# Patient Record
Sex: Female | Born: 1970 | Race: Black or African American | Hispanic: No | Marital: Single | Smoking: Never smoker
Health system: Southern US, Community
[De-identification: ages and names within clinical notes are randomized; demographics above are authoritative.]

## PROBLEM LIST (undated history)

## (undated) DIAGNOSIS — T7840XA Allergy, unspecified, initial encounter: Secondary | ICD-10-CM

## (undated) DIAGNOSIS — E282 Polycystic ovarian syndrome: Secondary | ICD-10-CM

## (undated) DIAGNOSIS — J45909 Unspecified asthma, uncomplicated: Secondary | ICD-10-CM

## (undated) DIAGNOSIS — R911 Solitary pulmonary nodule: Secondary | ICD-10-CM

## (undated) DIAGNOSIS — E785 Hyperlipidemia, unspecified: Secondary | ICD-10-CM

## (undated) DIAGNOSIS — I2510 Atherosclerotic heart disease of native coronary artery without angina pectoris: Secondary | ICD-10-CM

## (undated) DIAGNOSIS — I10 Essential (primary) hypertension: Secondary | ICD-10-CM

## (undated) DIAGNOSIS — E669 Obesity, unspecified: Secondary | ICD-10-CM

## (undated) DIAGNOSIS — E119 Type 2 diabetes mellitus without complications: Secondary | ICD-10-CM

## (undated) DIAGNOSIS — K219 Gastro-esophageal reflux disease without esophagitis: Secondary | ICD-10-CM

## (undated) HISTORY — DX: Polycystic ovarian syndrome: E28.2

## (undated) HISTORY — DX: Essential (primary) hypertension: I10

## (undated) HISTORY — DX: Allergy, unspecified, initial encounter: T78.40XA

## (undated) HISTORY — PX: WISDOM TOOTH EXTRACTION: SHX21

---

## 2002-05-14 ENCOUNTER — Other Ambulatory Visit: Admission: RE | Admit: 2002-05-14 | Discharge: 2002-05-14 | Payer: Self-pay | Admitting: Family Medicine

## 2003-05-20 ENCOUNTER — Other Ambulatory Visit: Admission: RE | Admit: 2003-05-20 | Discharge: 2003-05-20 | Payer: Self-pay | Admitting: Family Medicine

## 2004-09-11 ENCOUNTER — Other Ambulatory Visit: Admission: RE | Admit: 2004-09-11 | Discharge: 2004-09-11 | Payer: Self-pay | Admitting: Family Medicine

## 2005-09-18 ENCOUNTER — Other Ambulatory Visit: Admission: RE | Admit: 2005-09-18 | Discharge: 2005-09-18 | Payer: Self-pay | Admitting: Family Medicine

## 2011-04-05 ENCOUNTER — Other Ambulatory Visit: Payer: Self-pay | Admitting: Family Medicine

## 2011-04-05 ENCOUNTER — Other Ambulatory Visit (HOSPITAL_COMMUNITY)
Admission: RE | Admit: 2011-04-05 | Discharge: 2011-04-05 | Disposition: A | Discharge status: Home / Self Care | Payer: BC Managed Care – PPO | Source: Ambulatory Visit | Attending: Family Medicine | Admitting: Family Medicine

## 2011-04-05 DIAGNOSIS — Z0000 Encounter for general adult medical examination without abnormal findings: Secondary | ICD-10-CM | POA: Insufficient documentation

## 2013-05-12 ENCOUNTER — Other Ambulatory Visit: Payer: Self-pay | Admitting: Gastroenterology

## 2013-09-29 ENCOUNTER — Other Ambulatory Visit: Payer: Self-pay

## 2013-09-29 DIAGNOSIS — Z1231 Encounter for screening mammogram for malignant neoplasm of breast: Secondary | ICD-10-CM

## 2013-10-29 ENCOUNTER — Ambulatory Visit
Admission: RE | Admit: 2013-10-29 | Discharge: 2013-10-29 | Disposition: A | Discharge status: Home / Self Care | Payer: BC Managed Care – PPO | Source: Ambulatory Visit

## 2013-10-29 DIAGNOSIS — Z1231 Encounter for screening mammogram for malignant neoplasm of breast: Secondary | ICD-10-CM

## 2014-08-19 ENCOUNTER — Other Ambulatory Visit (HOSPITAL_COMMUNITY)
Admission: RE | Admit: 2014-08-19 | Discharge: 2014-08-19 | Disposition: A | Discharge status: Home / Self Care | Payer: BC Managed Care – PPO | Source: Ambulatory Visit | Attending: Family Medicine | Admitting: Family Medicine

## 2014-08-19 ENCOUNTER — Other Ambulatory Visit: Payer: Self-pay | Admitting: Family Medicine

## 2014-08-19 DIAGNOSIS — Z01419 Encounter for gynecological examination (general) (routine) without abnormal findings: Secondary | ICD-10-CM | POA: Insufficient documentation

## 2014-08-19 DIAGNOSIS — Z1151 Encounter for screening for human papillomavirus (HPV): Secondary | ICD-10-CM | POA: Insufficient documentation

## 2014-08-22 LAB — CYTOLOGY - PAP

## 2014-09-26 ENCOUNTER — Other Ambulatory Visit: Payer: Self-pay

## 2014-09-26 DIAGNOSIS — Z1239 Encounter for other screening for malignant neoplasm of breast: Secondary | ICD-10-CM

## 2014-10-28 ENCOUNTER — Other Ambulatory Visit: Payer: Self-pay

## 2014-10-28 DIAGNOSIS — Z1231 Encounter for screening mammogram for malignant neoplasm of breast: Secondary | ICD-10-CM

## 2014-11-01 ENCOUNTER — Ambulatory Visit
Admission: RE | Admit: 2014-11-01 | Discharge: 2014-11-01 | Disposition: A | Discharge status: Home / Self Care | Payer: BC Managed Care – PPO | Source: Ambulatory Visit

## 2014-11-01 DIAGNOSIS — Z1231 Encounter for screening mammogram for malignant neoplasm of breast: Secondary | ICD-10-CM

## 2014-11-11 ENCOUNTER — Other Ambulatory Visit: Payer: Self-pay | Admitting: Allergy and Immunology

## 2014-11-11 ENCOUNTER — Ambulatory Visit
Admission: RE | Admit: 2014-11-11 | Discharge: 2014-11-11 | Disposition: A | Discharge status: Home / Self Care | Payer: BC Managed Care – PPO | Source: Ambulatory Visit | Attending: Allergy and Immunology | Admitting: Allergy and Immunology

## 2014-11-11 DIAGNOSIS — R059 Cough, unspecified: Secondary | ICD-10-CM

## 2014-11-11 DIAGNOSIS — R05 Cough: Secondary | ICD-10-CM

## 2015-10-13 ENCOUNTER — Other Ambulatory Visit: Payer: Self-pay

## 2015-10-13 DIAGNOSIS — Z1231 Encounter for screening mammogram for malignant neoplasm of breast: Secondary | ICD-10-CM

## 2015-11-15 ENCOUNTER — Ambulatory Visit: Payer: BC Managed Care – PPO

## 2016-01-09 ENCOUNTER — Ambulatory Visit
Admission: RE | Admit: 2016-01-09 | Discharge: 2016-01-09 | Disposition: A | Discharge status: Home / Self Care | Payer: BC Managed Care – PPO | Source: Ambulatory Visit

## 2016-01-09 DIAGNOSIS — Z1231 Encounter for screening mammogram for malignant neoplasm of breast: Secondary | ICD-10-CM

## 2016-12-13 ENCOUNTER — Other Ambulatory Visit: Payer: Self-pay | Admitting: Family Medicine

## 2016-12-13 DIAGNOSIS — Z1231 Encounter for screening mammogram for malignant neoplasm of breast: Secondary | ICD-10-CM

## 2017-01-29 ENCOUNTER — Ambulatory Visit
Admission: RE | Admit: 2017-01-29 | Discharge: 2017-01-29 | Disposition: A | Discharge status: Home / Self Care | Payer: BC Managed Care – PPO | Source: Ambulatory Visit | Attending: Family Medicine | Admitting: Family Medicine

## 2017-01-29 DIAGNOSIS — Z1231 Encounter for screening mammogram for malignant neoplasm of breast: Secondary | ICD-10-CM

## 2017-12-25 ENCOUNTER — Other Ambulatory Visit: Payer: Self-pay | Admitting: Family Medicine

## 2017-12-25 DIAGNOSIS — Z1231 Encounter for screening mammogram for malignant neoplasm of breast: Secondary | ICD-10-CM

## 2018-01-30 ENCOUNTER — Ambulatory Visit
Admission: RE | Admit: 2018-01-30 | Discharge: 2018-01-30 | Disposition: A | Discharge status: Home / Self Care | Payer: BC Managed Care – PPO | Source: Ambulatory Visit | Attending: Family Medicine | Admitting: Family Medicine

## 2018-01-30 DIAGNOSIS — Z1231 Encounter for screening mammogram for malignant neoplasm of breast: Secondary | ICD-10-CM

## 2018-12-24 ENCOUNTER — Other Ambulatory Visit: Payer: Self-pay | Admitting: Family Medicine

## 2018-12-24 DIAGNOSIS — Z1231 Encounter for screening mammogram for malignant neoplasm of breast: Secondary | ICD-10-CM

## 2019-02-01 ENCOUNTER — Ambulatory Visit
Admission: RE | Admit: 2019-02-01 | Discharge: 2019-02-01 | Disposition: A | Discharge status: Home / Self Care | Payer: BC Managed Care – PPO | Source: Ambulatory Visit | Attending: Family Medicine | Admitting: Family Medicine

## 2019-02-01 DIAGNOSIS — Z1231 Encounter for screening mammogram for malignant neoplasm of breast: Secondary | ICD-10-CM

## 2020-01-03 ENCOUNTER — Other Ambulatory Visit: Payer: Self-pay | Admitting: Family Medicine

## 2020-01-03 DIAGNOSIS — Z1231 Encounter for screening mammogram for malignant neoplasm of breast: Secondary | ICD-10-CM

## 2020-02-08 ENCOUNTER — Other Ambulatory Visit: Payer: Self-pay

## 2020-02-08 ENCOUNTER — Ambulatory Visit
Admission: RE | Admit: 2020-02-08 | Discharge: 2020-02-08 | Disposition: A | Discharge status: Home / Self Care | Payer: BC Managed Care – PPO | Source: Ambulatory Visit | Attending: Family Medicine | Admitting: Family Medicine

## 2020-02-08 DIAGNOSIS — Z1231 Encounter for screening mammogram for malignant neoplasm of breast: Secondary | ICD-10-CM

## 2020-03-16 ENCOUNTER — Other Ambulatory Visit (HOSPITAL_COMMUNITY)
Admission: RE | Admit: 2020-03-16 | Discharge: 2020-03-16 | Disposition: A | Discharge status: Home / Self Care | Payer: BC Managed Care – PPO | Source: Ambulatory Visit | Attending: Family Medicine | Admitting: Family Medicine

## 2020-03-16 DIAGNOSIS — Z124 Encounter for screening for malignant neoplasm of cervix: Secondary | ICD-10-CM | POA: Insufficient documentation

## 2020-03-20 LAB — CYTOLOGY - PAP
Comment: NEGATIVE
Diagnosis: NEGATIVE
High risk HPV: NEGATIVE

## 2020-10-07 ENCOUNTER — Ambulatory Visit: Payer: BC Managed Care – PPO | Attending: Internal Medicine

## 2020-10-07 DIAGNOSIS — Z23 Encounter for immunization: Secondary | ICD-10-CM

## 2020-10-07 NOTE — Progress Notes (Signed)
   Covid-19 Vaccination Clinic  Name:  Kailynne Ferrington    MRN: [id number] DOB: 17-Jun-1971  10/07/2020  Ms. Lattner was observed post Covid-19 immunization for 15 minutes without incident. She was provided with Vaccine Information Sheet and instruction to access the V-Safe system.   Ms. Menor was instructed to call 911 with any severe reactions post vaccine: Marland Kitchen Difficulty breathing  . Swelling of face and throat  . A fast heartbeat  . A bad rash all over body  . Dizziness and weakness

## 2021-01-10 ENCOUNTER — Other Ambulatory Visit: Payer: Self-pay | Admitting: Family Medicine

## 2021-01-10 DIAGNOSIS — Z1231 Encounter for screening mammogram for malignant neoplasm of breast: Secondary | ICD-10-CM

## 2021-02-09 ENCOUNTER — Inpatient Hospital Stay: Admission: RE | Admit: 2021-02-09 | Payer: BC Managed Care – PPO | Source: Ambulatory Visit

## 2021-02-09 DIAGNOSIS — Z1231 Encounter for screening mammogram for malignant neoplasm of breast: Secondary | ICD-10-CM

## 2021-04-04 ENCOUNTER — Inpatient Hospital Stay: Admission: RE | Admit: 2021-04-04 | Payer: BC Managed Care – PPO | Source: Ambulatory Visit

## 2021-05-23 ENCOUNTER — Ambulatory Visit
Admission: RE | Admit: 2021-05-23 | Discharge: 2021-05-23 | Disposition: A | Discharge status: Home / Self Care | Payer: BC Managed Care – PPO | Source: Ambulatory Visit | Attending: Family Medicine | Admitting: Family Medicine

## 2021-05-23 ENCOUNTER — Other Ambulatory Visit: Payer: Self-pay

## 2021-05-23 DIAGNOSIS — Z1231 Encounter for screening mammogram for malignant neoplasm of breast: Secondary | ICD-10-CM

## 2021-05-25 ENCOUNTER — Other Ambulatory Visit: Payer: Self-pay | Admitting: Family Medicine

## 2021-05-28 ENCOUNTER — Other Ambulatory Visit: Payer: Self-pay | Admitting: Family Medicine

## 2021-05-28 DIAGNOSIS — R928 Other abnormal and inconclusive findings on diagnostic imaging of breast: Secondary | ICD-10-CM

## 2021-05-30 ENCOUNTER — Ambulatory Visit
Admission: RE | Admit: 2021-05-30 | Discharge: 2021-05-30 | Disposition: A | Discharge status: Home / Self Care | Payer: BC Managed Care – PPO | Source: Ambulatory Visit | Attending: Family Medicine | Admitting: Family Medicine

## 2021-05-30 ENCOUNTER — Other Ambulatory Visit: Payer: Self-pay

## 2021-05-30 DIAGNOSIS — R928 Other abnormal and inconclusive findings on diagnostic imaging of breast: Secondary | ICD-10-CM

## 2021-06-26 IMAGING — MG MM DIGITAL SCREENING BILAT W/ TOMO AND CAD
6 of 10 series · 6 of 30 positions shown · non-contrast
Comparison: Previous exam(s).

CLINICAL DATA: Screening.

EXAM:
DIGITAL SCREENING BILATERAL MAMMOGRAM WITH TOMOSYNTHESIS AND CAD
TECHNIQUE: Bilateral screening digital craniocaudal and mediolateral oblique
mammograms were obtained. Bilateral screening digital breast
tomosynthesis was performed. The images were evaluated with
computer-aided detection.

[L MLO synth-2D]
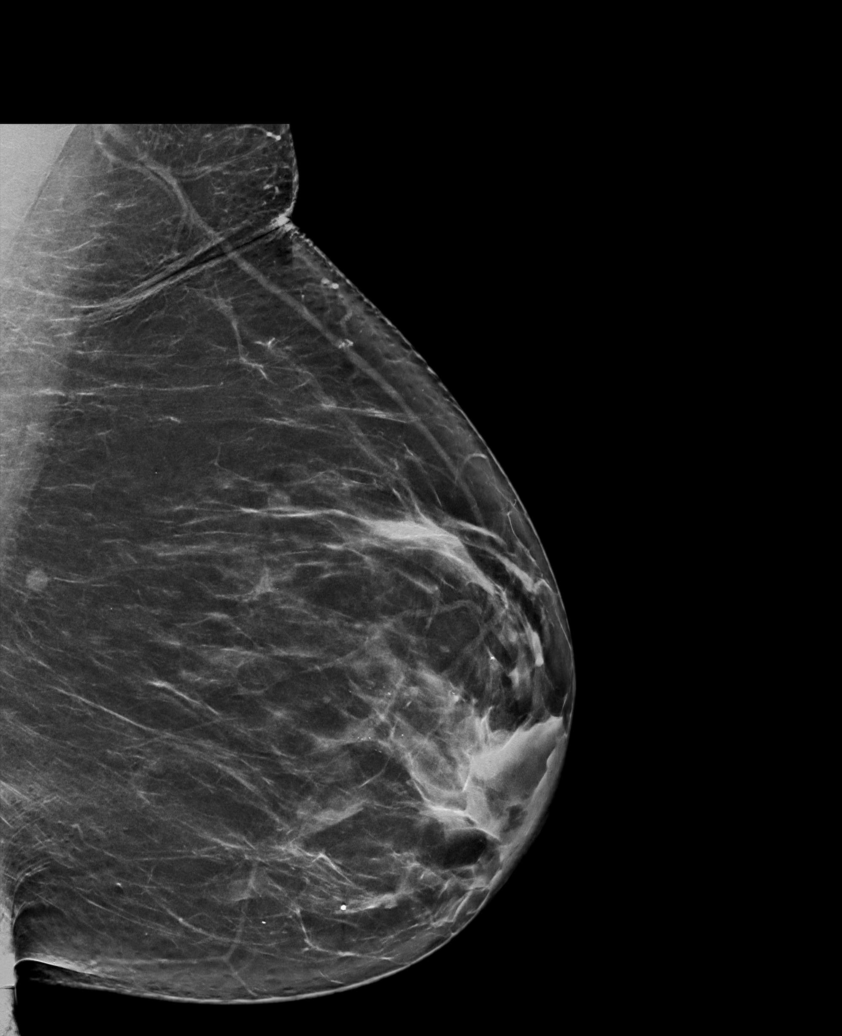

[L CC synth-2D]
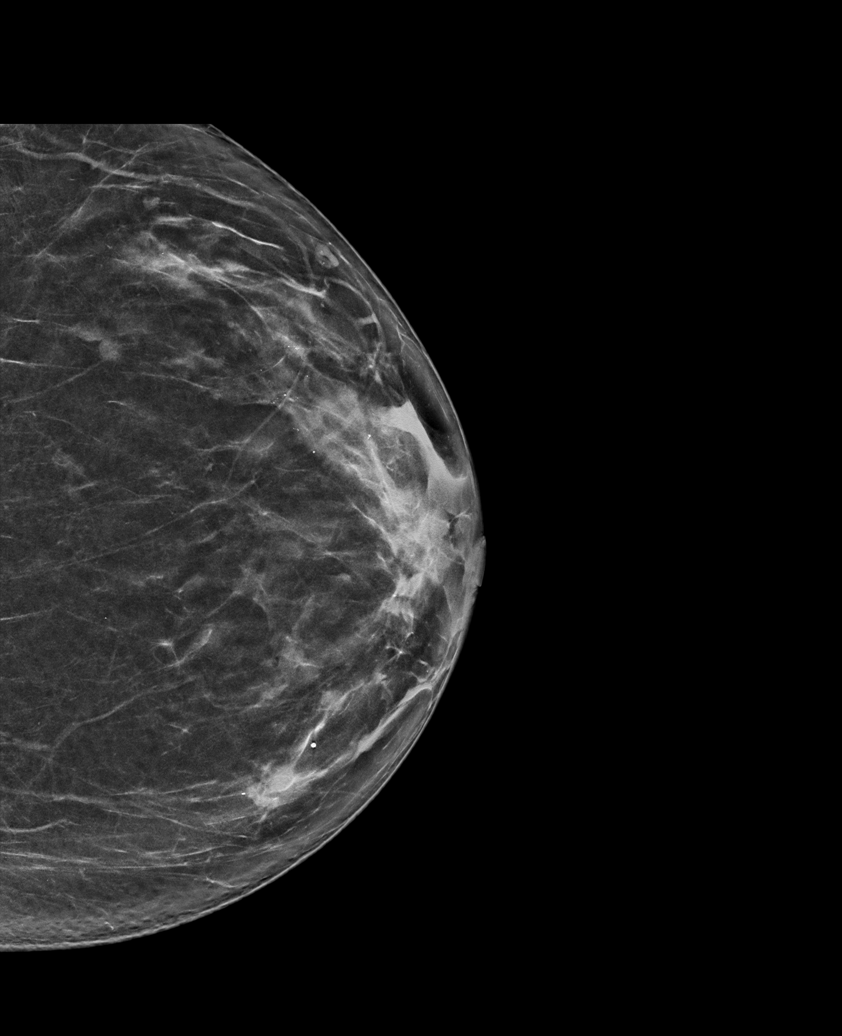

[R CC synth-2D (1 of 2)]
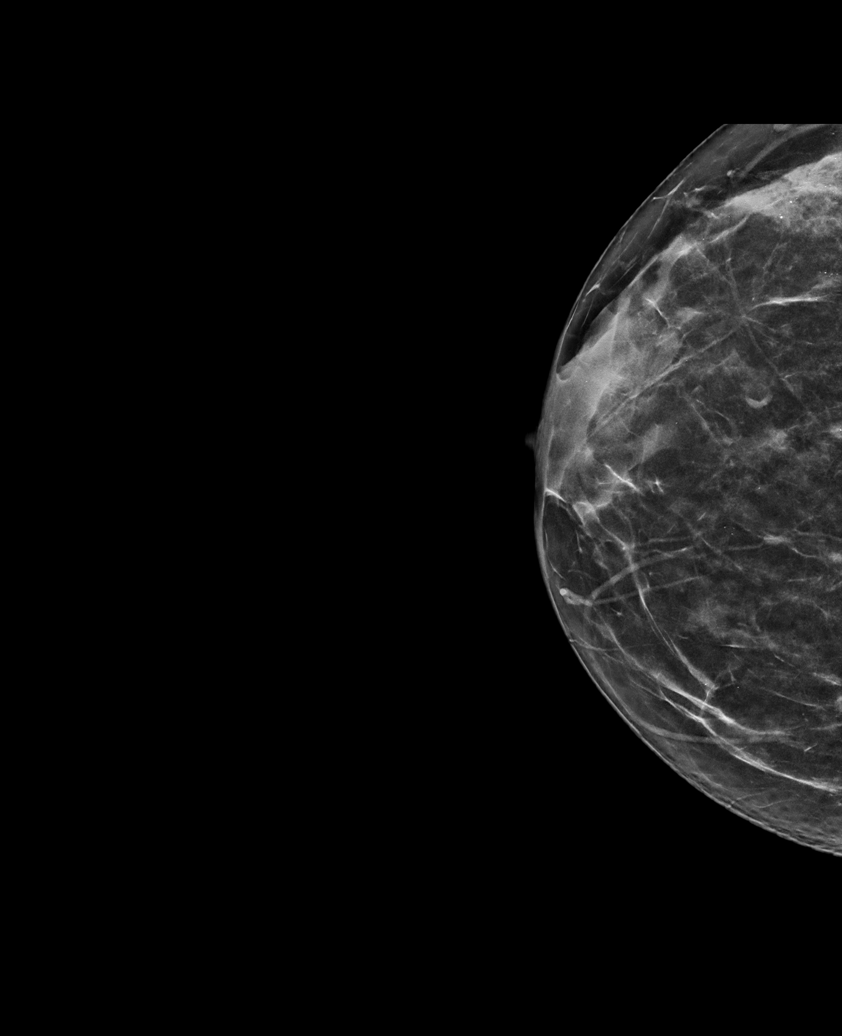

[R MLO synth-2D]
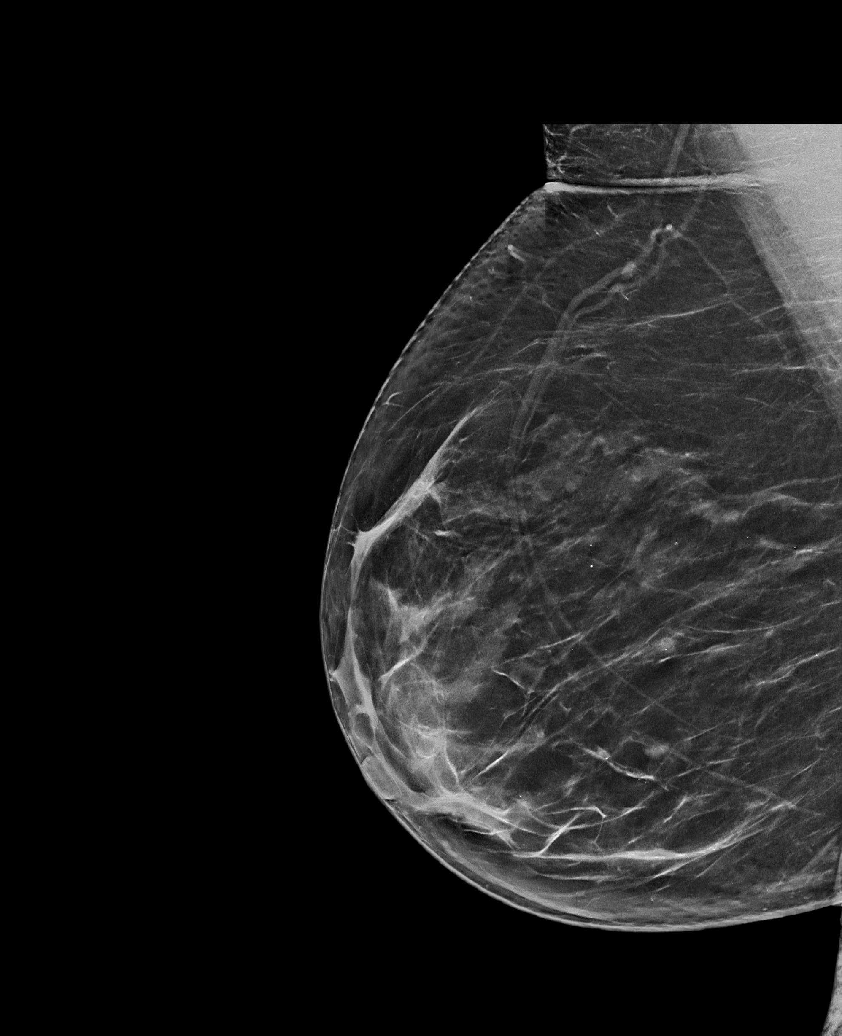

[R CC synth-2D (2 of 2)]
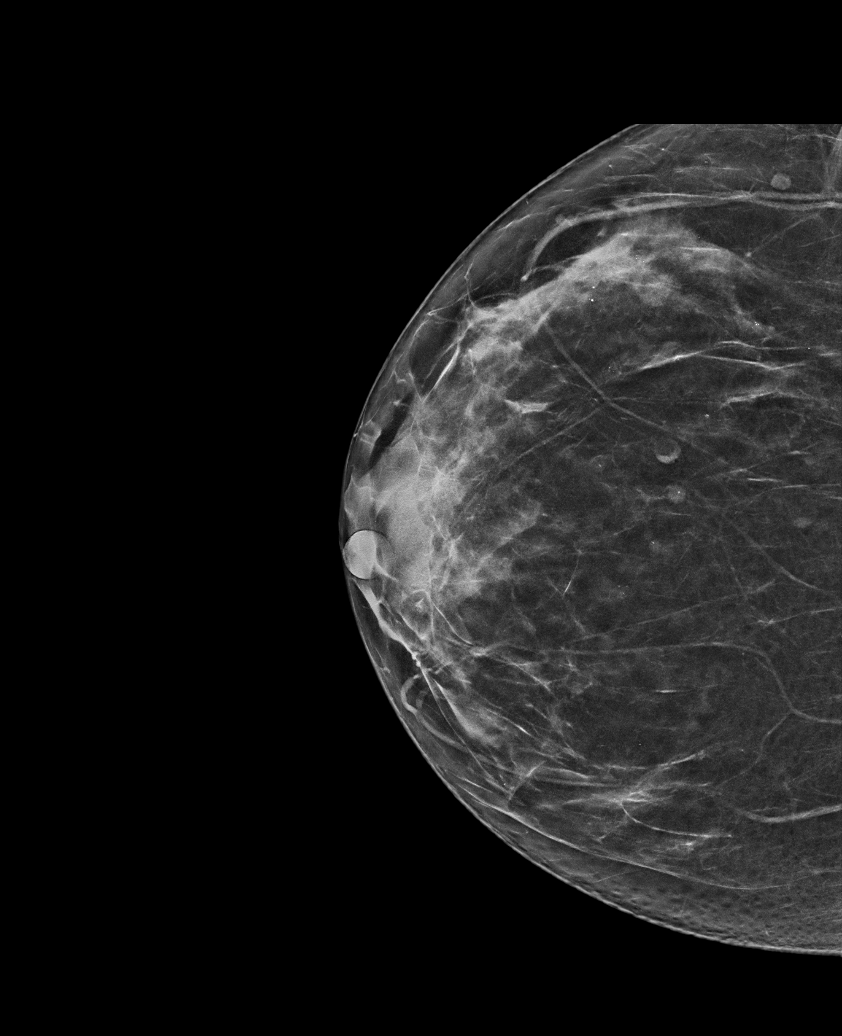

[R CC tomo · tomo slice 35/70.0]
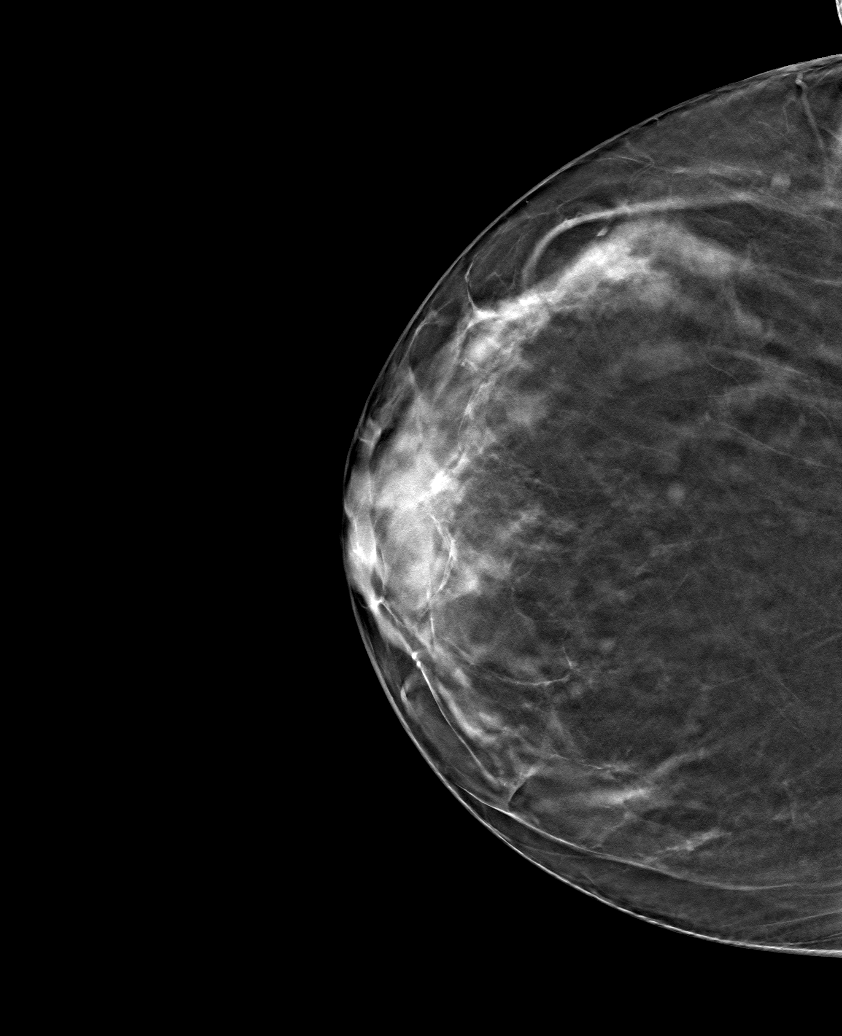

[6 of 30 positions shown; findings below may reference images not displayed]

ACR Breast Density Category c: The breast tissue is heterogeneously
dense, which may obscure small masses.
FINDINGS: In the right breast, a possible mass warrants further evaluation. In
the left breast, no findings suspicious for malignancy.
IMPRESSION: Further evaluation is suggested for a possible mass in the right
breast.

RECOMMENDATION:
Diagnostic mammogram and possibly ultrasound of the right breast.
(Code:82-G-AAO)

The patient will be contacted regarding the findings, and additional
imaging will be scheduled.

BI-RADS CATEGORY  0: Incomplete. Need additional imaging evaluation
and/or prior mammograms for comparison.

## 2022-01-03 ENCOUNTER — Other Ambulatory Visit: Payer: Self-pay | Admitting: Sports Medicine

## 2022-01-03 DIAGNOSIS — M5442 Lumbago with sciatica, left side: Secondary | ICD-10-CM

## 2022-01-27 ENCOUNTER — Other Ambulatory Visit: Payer: Self-pay

## 2022-01-27 ENCOUNTER — Ambulatory Visit
Admission: RE | Admit: 2022-01-27 | Discharge: 2022-01-27 | Disposition: A | Discharge status: Home / Self Care | Payer: BC Managed Care – PPO | Source: Ambulatory Visit | Attending: Sports Medicine | Admitting: Sports Medicine

## 2022-01-27 DIAGNOSIS — M5442 Lumbago with sciatica, left side: Secondary | ICD-10-CM

## 2022-05-16 ENCOUNTER — Other Ambulatory Visit: Payer: Self-pay | Admitting: Family Medicine

## 2022-05-16 DIAGNOSIS — Z1231 Encounter for screening mammogram for malignant neoplasm of breast: Secondary | ICD-10-CM

## 2022-05-31 ENCOUNTER — Ambulatory Visit
Admission: RE | Admit: 2022-05-31 | Discharge: 2022-05-31 | Disposition: A | Discharge status: Home / Self Care | Payer: BC Managed Care – PPO | Source: Ambulatory Visit | Attending: Family Medicine | Admitting: Family Medicine

## 2022-05-31 DIAGNOSIS — Z1231 Encounter for screening mammogram for malignant neoplasm of breast: Secondary | ICD-10-CM

## 2023-04-21 ENCOUNTER — Other Ambulatory Visit: Payer: Self-pay | Admitting: Family Medicine

## 2023-04-21 DIAGNOSIS — Z1231 Encounter for screening mammogram for malignant neoplasm of breast: Secondary | ICD-10-CM

## 2023-06-06 ENCOUNTER — Ambulatory Visit
Admission: RE | Admit: 2023-06-06 | Discharge: 2023-06-06 | Disposition: A | Discharge status: Home / Self Care | Payer: BC Managed Care – PPO | Source: Ambulatory Visit

## 2023-06-06 DIAGNOSIS — Z1231 Encounter for screening mammogram for malignant neoplasm of breast: Secondary | ICD-10-CM

## 2023-07-07 ENCOUNTER — Other Ambulatory Visit (HOSPITAL_BASED_OUTPATIENT_CLINIC_OR_DEPARTMENT_OTHER): Payer: Self-pay | Admitting: Family Medicine

## 2023-07-07 DIAGNOSIS — Z8249 Family history of ischemic heart disease and other diseases of the circulatory system: Secondary | ICD-10-CM

## 2023-08-14 ENCOUNTER — Ambulatory Visit (HOSPITAL_BASED_OUTPATIENT_CLINIC_OR_DEPARTMENT_OTHER): Payer: BC Managed Care – PPO

## 2023-09-25 ENCOUNTER — Ambulatory Visit (HOSPITAL_BASED_OUTPATIENT_CLINIC_OR_DEPARTMENT_OTHER): Payer: BC Managed Care – PPO

## 2023-10-21 ENCOUNTER — Ambulatory Visit (HOSPITAL_BASED_OUTPATIENT_CLINIC_OR_DEPARTMENT_OTHER)
Admission: RE | Admit: 2023-10-21 | Discharge: 2023-10-21 | Disposition: A | Discharge status: Home / Self Care | Payer: BC Managed Care – PPO | Source: Ambulatory Visit | Attending: Family Medicine | Admitting: Family Medicine

## 2023-10-21 DIAGNOSIS — Z8249 Family history of ischemic heart disease and other diseases of the circulatory system: Secondary | ICD-10-CM | POA: Insufficient documentation

## 2024-01-07 ENCOUNTER — Other Ambulatory Visit: Payer: Self-pay | Admitting: Family Medicine

## 2024-01-07 ENCOUNTER — Encounter: Payer: Self-pay | Admitting: Family Medicine

## 2024-01-07 DIAGNOSIS — R911 Solitary pulmonary nodule: Secondary | ICD-10-CM

## 2024-01-08 ENCOUNTER — Inpatient Hospital Stay: Admission: RE | Admit: 2024-01-08 | Payer: Self-pay | Source: Ambulatory Visit

## 2024-01-28 ENCOUNTER — Inpatient Hospital Stay: Admission: RE | Admit: 2024-01-28 | Payer: 59 | Source: Ambulatory Visit

## 2024-02-06 ENCOUNTER — Ambulatory Visit
Admission: RE | Admit: 2024-02-06 | Discharge: 2024-02-06 | Disposition: A | Discharge status: Home / Self Care | Payer: 59 | Source: Ambulatory Visit | Attending: Family Medicine | Admitting: Family Medicine

## 2024-02-06 DIAGNOSIS — R911 Solitary pulmonary nodule: Secondary | ICD-10-CM

## 2024-05-04 ENCOUNTER — Other Ambulatory Visit: Payer: Self-pay | Admitting: Family Medicine

## 2024-05-04 DIAGNOSIS — R911 Solitary pulmonary nodule: Secondary | ICD-10-CM

## 2024-05-12 ENCOUNTER — Other Ambulatory Visit: Payer: Self-pay | Admitting: Family Medicine

## 2024-05-12 DIAGNOSIS — Z1231 Encounter for screening mammogram for malignant neoplasm of breast: Secondary | ICD-10-CM

## 2024-05-17 ENCOUNTER — Encounter: Payer: Self-pay | Admitting: Family Medicine

## 2024-05-18 ENCOUNTER — Other Ambulatory Visit

## 2024-05-20 ENCOUNTER — Ambulatory Visit
Admission: RE | Admit: 2024-05-20 | Discharge: 2024-05-20 | Disposition: A | Discharge status: Home / Self Care | Source: Ambulatory Visit | Attending: Family Medicine | Admitting: Family Medicine

## 2024-05-20 DIAGNOSIS — R911 Solitary pulmonary nodule: Secondary | ICD-10-CM

## 2024-05-31 ENCOUNTER — Other Ambulatory Visit (HOSPITAL_COMMUNITY): Payer: Self-pay | Admitting: Family Medicine

## 2024-05-31 DIAGNOSIS — R918 Other nonspecific abnormal finding of lung field: Secondary | ICD-10-CM

## 2024-05-31 DIAGNOSIS — R911 Solitary pulmonary nodule: Secondary | ICD-10-CM

## 2024-06-02 ENCOUNTER — Telehealth: Payer: Self-pay

## 2024-06-02 ENCOUNTER — Encounter: Payer: Self-pay | Admitting: Pulmonary Disease

## 2024-06-02 ENCOUNTER — Ambulatory Visit: Admitting: Pulmonary Disease

## 2024-06-02 VITALS — BP 132/76 | HR 109 | Temp 97.1°F | Ht 63.0 in | Wt 220.2 lb

## 2024-06-02 DIAGNOSIS — R911 Solitary pulmonary nodule: Secondary | ICD-10-CM | POA: Insufficient documentation

## 2024-06-02 NOTE — Progress Notes (Signed)
 Synopsis: Referred in by Amy Channel, MD   Subjective:   PATIENT ID: Amy Gillespie GENDER: female DOB: 03/17/1971, MRN: [id number]  Chief Complaint  Patient presents with   Consult    Nodule. No SOB or wheezing. Dry cough.     HPI Ms. Amy Gillespie is a pleasant 53 year old female patient with a past medical history of mild persistent asthma, hypertension, hyperlipidemia, type 2 diabetes mellitus presenting today to the pulmonary clinic for further evaluation of right lower lobe lung nodule.  She underwent a CT cardiac scoring to evaluate her coronary artery disease risk factors and was found to have right lower lobe nodule in November 2024.  At that time this measured 10 x 13 mm.  She subsequently had repeat CT chest in February 2025 which redemonstrated the right lower lobe nodule measuring 10 x 8.4 mm.  She had a repeat CT chest in 3 months interval in June 2025 which redemonstrated the proximal left upper cavitary nodule with minimal progression now at 1.6 cm diameter.  A PET scan is scheduled for July.  She denies any respiratory symptoms.  Says her asthma is controlled on Symbicort.  Family history -she denies any family history of lung diseases.  Social history -never smoker denies vaping.  ROS All systems were reviewed and are negative except for the above Objective:   Vitals:   06/02/24 1322  BP: 132/76  Pulse: (!) 109  Temp: (!) 97.1 F (36.2 C)  SpO2: 98%  Weight: 220 lb 3.2 oz (99.9 kg)  Height: 5' 3 (1.6 m)   98% on RA BMI Readings from Last 3 Encounters:  06/02/24 39.01 kg/m   Wt Readings from Last 3 Encounters:  06/02/24 220 lb 3.2 oz (99.9 kg)    Physical Exam GEN: NAD, Healthy Appearing HEENT: Supple Neck, Reactive Pupils, EOMI  CVS: Normal S1, Normal S2, RRR, No murmurs or ES appreciated  Lungs: Clear bilateral air entry.  Abdomen: Soft, non tender, non distended, + BS  Extremities: Warm and well perfused, No edema  Skin: No suspicious  lesions appreciated  Psych: Normal Affect  Ancillary Information   CBC No results found for: WBC, RBC, HGB, HCT, PLT, MCV, MCH, MCHC, RDW, LYMPHSABS, MONOABS, EOSABS, BASOSABS  Labs and imaging were reviewed.     No data to display           Assessment & Plan:  Ms. Amy Gillespie is a pleasant 53 year old female patient with a past medical history of mild persistent asthma, hypertension, hyperlipidemia, type 2 diabetes mellitus presenting today to the pulmonary clinic for further evaluation of right lower lobe lung nodule.  # Right lower lobe nodule Mayo 5.8% probability of malignancy.  Discussed the risk and benefit of proceeding with robotic assisted navigational bronchoscopy including less than 1% risk of hemorrhage and pneumothorax.  She is agreeable with proceeding with the procedure.  []  PET scan and CT super D 06/07/2024.  []  RANB + EBUS 06/15/2024  #Mild persistent asthma  []  C/w Sumbicort 80-4.5 2 Puffs BID.  []  Albuterol PRN    Return in about 3 months (around 09/02/2024).  I spent 60 minutes caring for this patient today, including preparing to see the patient, obtaining a medical history , reviewing a separately obtained history, performing a medically appropriate examination and/or evaluation, counseling and educating the patient/family/caregiver, ordering medications, tests, or procedures, documenting clinical information in the electronic health record, and independently interpreting results (not separately reported/billed) and communicating results to the patient/family/caregiver  Darrin Barn, MD  [facility] 06/02/2024 1:51 PM

## 2024-06-02 NOTE — Telephone Encounter (Signed)
 Robotic Bronchoscopy with EBUS 06/15/2024 at 12:45pm Lung Nodule 31627, 31652, [id number]  [place] please see Bronch info.

## 2024-06-02 NOTE — Telephone Encounter (Signed)
 Patient is aware of date and time. Bronch email has been sent.

## 2024-06-03 NOTE — Telephone Encounter (Signed)
 For the codes 68372, [id number], [phone] Prior Auth Not Required Refer # [id number]

## 2024-06-03 NOTE — Telephone Encounter (Signed)
 Noted. Nothing further needed.

## 2024-06-04 ENCOUNTER — Encounter

## 2024-06-04 DIAGNOSIS — Z1231 Encounter for screening mammogram for malignant neoplasm of breast: Secondary | ICD-10-CM

## 2024-06-07 ENCOUNTER — Ambulatory Visit
Admission: RE | Admit: 2024-06-07 | Discharge: 2024-06-07 | Disposition: A | Discharge status: Home / Self Care | Source: Ambulatory Visit | Attending: Family Medicine | Admitting: Family Medicine

## 2024-06-07 ENCOUNTER — Ambulatory Visit
Admission: RE | Admit: 2024-06-07 | Discharge: 2024-06-07 | Disposition: A | Discharge status: Home / Self Care | Source: Ambulatory Visit | Attending: Pulmonary Disease | Admitting: Pulmonary Disease

## 2024-06-07 DIAGNOSIS — I2510 Atherosclerotic heart disease of native coronary artery without angina pectoris: Secondary | ICD-10-CM | POA: Diagnosis not present

## 2024-06-07 DIAGNOSIS — R918 Other nonspecific abnormal finding of lung field: Secondary | ICD-10-CM

## 2024-06-07 DIAGNOSIS — R911 Solitary pulmonary nodule: Secondary | ICD-10-CM | POA: Diagnosis present

## 2024-06-07 LAB — GLUCOSE, CAPILLARY: Glucose-Capillary: 75 mg/dL (ref 70–99)

## 2024-06-07 MED ORDER — FLUDEOXYGLUCOSE F - 18 (FDG) INJECTION
12.1500 | Freq: Once | INTRAVENOUS | Status: AC | PRN
  Administered 2024-06-07: 12.15 via INTRAVENOUS

## 2024-06-09 ENCOUNTER — Telehealth: Payer: Self-pay | Admitting: Pulmonary Disease

## 2024-06-09 ENCOUNTER — Inpatient Hospital Stay
Admission: RE | Admit: 2024-06-09 | Discharge: 2024-06-09 | Disposition: A | Discharge status: Home / Self Care | Source: Ambulatory Visit

## 2024-06-09 DIAGNOSIS — R911 Solitary pulmonary nodule: Secondary | ICD-10-CM

## 2024-06-09 HISTORY — DX: Solitary pulmonary nodule: R91.1

## 2024-06-09 HISTORY — DX: Hyperlipidemia, unspecified: E78.5

## 2024-06-09 HISTORY — DX: Type 2 diabetes mellitus without complications: E11.9

## 2024-06-09 HISTORY — DX: Unspecified asthma, uncomplicated: J45.909

## 2024-06-09 HISTORY — DX: Gastro-esophageal reflux disease without esophagitis: K21.9

## 2024-06-09 NOTE — Progress Notes (Addendum)
 Called pt to do her anesthesia interview for upcoming EBUS on 06-15-24. PT states she spoke with Dr Malka this morning regarding her PET results. Pt just had a PET scan done and there was no uptake on the PET scan so surgery will be cancelled and will reassess in 3 months to see if EBUS will be needed. Notified Lebron Sharps CMA at Dr Duwaine office  via secure chat

## 2024-06-09 NOTE — Patient Instructions (Signed)
 Your procedure is scheduled on:06-15-24 Tuesday Report to the Registration Desk on the 1st floor of the [facility].Then proceed to the 2nd floor Surgery Desk To find out your arrival time, please call [phone] between 1PM - 3PM on:06-14-24 Monday If your arrival time is 6:00 am, do not arrive before that time as the Medical Mall entrance doors do not open until 6:00 am.  REMEMBER: Instructions that are not followed completely may result in serious medical risk, up to and including death; or upon the discretion of your surgeon and anesthesiologist your surgery may need to be rescheduled.  Do not eat food OR drink liquids after midnight the night before surgery.  No gum chewing or hard candies.  One week prior to surgery:Stop NOW (06-09-24) Stop Anti-inflammatories (NSAIDS) such as Advil, Aleve, Ibuprofen, Motrin, Naproxen, Naprosyn and Aspirin based products such as Excedrin, Goody's Powder, BC Powder. Stop ANY OVER THE COUNTER supplements until after surgery.  You may however, continue to take Tylenol if needed for pain up until the day of surgery  Stop tirzepatide (ZEPBOUND) 7 days prior to surgery-Do NOT take again until AFTER your surgery  Stop metFORMIN (GLUCOPHAGE-XR) 2 days prior to surgery-Last dose will be on 06-12-24 Saturday  Continue taking all of your other prescription medications up until the day of surgery.  ON THE DAY OF SURGERY ONLY TAKE THESE MEDICATIONS WITH SIPS OF WATER: -atorvastatin (LIPITOR)  -esomeprazole (NEXIUM)  -montelukast (SINGULAIR)   Use your Albuterol Inhaler the day of surgery and bring Inhaler to the hospital  No Alcohol for 24 hours before or after surgery.  No Smoking including e-cigarettes for 24 hours before surgery.  No chewable tobacco products for at least 6 hours before surgery.  No nicotine patches on the day of surgery.  Do not use any recreational drugs for at least a week (preferably 2 weeks) before your surgery.  Please be  advised that the combination of cocaine and anesthesia may have negative outcomes, up to and including death. If you test positive for cocaine, your surgery will be cancelled.  On the morning of surgery brush your teeth with toothpaste and water, you may rinse your mouth with mouthwash if you wish. Do not swallow any toothpaste or mouthwash.  Do not wear jewelry, make-up, hairpins, clips or nail polish.  For welded (permanent) jewelry: bracelets, anklets, waist bands, etc.  Please have this removed prior to surgery.  If it is not removed, there is a chance that hospital personnel will need to cut it off on the day of surgery.  Do not wear lotions, powders, or perfumes.   Do not shave body hair from the neck down 48 hours before surgery.  Contact lenses, hearing aids and dentures may not be worn into surgery.  Do not bring valuables to the hospital. [facility] is not responsible for any missing/lost belongings or valuables.   Notify your doctor if there is any change in your medical condition (cold, fever, infection).  Wear comfortable clothing (specific to your surgery type) to the hospital.  After surgery, you can help prevent lung complications by doing breathing exercises.  Take deep breaths and cough every 1-2 hours. Your doctor may order a device called an Incentive Spirometer to help you take deep breaths. When coughing or sneezing, hold a pillow firmly against your incision with both hands. This is called "splinting." Doing this helps protect your incision. It also decreases belly discomfort.  If you are being admitted to the hospital overnight, leave  your suitcase in the car. After surgery it may be brought to your room.  In case of increased patient census, it may be necessary for you, the patient, to continue your postoperative care in the Same Day Surgery department.  If you are being discharged the day of surgery, you will not be allowed to drive home. You will need a  responsible individual to drive you home and stay with you for 24 hours after surgery.   If you are taking public transportation, you will need to have a responsible individual with you.  Please call the Pre-admissions Testing Dept. at [phone] if you have any questions about these instructions.  Surgery Visitation Policy:  Patients having surgery or a procedure may have two visitors.  Children under the age of 80 must have an adult with them who is not the patient.   Merchandiser, retail to address health-related social needs:  [email or web address]

## 2024-06-09 NOTE — Telephone Encounter (Signed)
 Discussed results of the PET scan with [name], lung nodule with faint uptake, 12.2% risk of malignancy based on the herder model as well as MAYO. Given the peripheral position in the lower lobes with high risk of atelectasis and non diagnostic procedure with low risk of malignancy we decided to proceed with radiological monitoring of the nodule and cancel the procedure next week, patient is in agreement.  Darrin Barn, MD [facility] 06/09/2024 5:57 PM

## 2024-06-10 ENCOUNTER — Ambulatory Visit
Admission: RE | Admit: 2024-06-10 | Discharge: 2024-06-10 | Disposition: A | Discharge status: Home / Self Care | Source: Ambulatory Visit

## 2024-06-10 DIAGNOSIS — Z1231 Encounter for screening mammogram for malignant neoplasm of breast: Secondary | ICD-10-CM

## 2024-06-15 ENCOUNTER — Other Ambulatory Visit (HOSPITAL_COMMUNITY)

## 2024-06-15 ENCOUNTER — Encounter: Admission: RE | Payer: Self-pay | Source: Home / Self Care

## 2024-06-15 ENCOUNTER — Ambulatory Visit: Admission: RE | Admit: 2024-06-15 | Source: Home / Self Care | Admitting: Pulmonary Disease

## 2024-06-15 DIAGNOSIS — R911 Solitary pulmonary nodule: Secondary | ICD-10-CM | POA: Insufficient documentation

## 2024-06-15 SURGERY — VIDEO BRONCHOSCOPY WITH ENDOBRONCHIAL NAVIGATION
Anesthesia: General | Laterality: Right

## 2024-09-07 ENCOUNTER — Ambulatory Visit
Admission: RE | Admit: 2024-09-07 | Discharge: 2024-09-07 | Disposition: A | Discharge status: Home / Self Care | Source: Ambulatory Visit | Attending: Pulmonary Disease | Admitting: Pulmonary Disease

## 2024-09-07 DIAGNOSIS — R911 Solitary pulmonary nodule: Secondary | ICD-10-CM | POA: Diagnosis present

## 2024-09-20 ENCOUNTER — Encounter: Payer: Self-pay | Admitting: Pulmonary Disease

## 2024-09-20 ENCOUNTER — Ambulatory Visit: Admitting: Pulmonary Disease

## 2024-09-20 VITALS — BP 124/72 | HR 86 | Temp 97.8°F | Ht 63.0 in | Wt 226.4 lb

## 2024-09-20 DIAGNOSIS — R911 Solitary pulmonary nodule: Secondary | ICD-10-CM | POA: Diagnosis not present

## 2024-09-20 NOTE — Progress Notes (Signed)
 Synopsis: Referred in by Teresa Channel, MD   Subjective:   PATIENT ID: Amy Gillespie GENDER: female DOB: 1971/01/28, MRN: [id number]  Chief Complaint  Patient presents with   Medical Management of Chronic Issues    No SOB, wheezing or cough.  Using Symbicort BID, Albuterol as needed.    HPI Amy Gillespie is a pleasant 53 year old female patient with a past medical history of mild persistent asthma, hypertension, hyperlipidemia, type 2 diabetes mellitus presenting today to the pulmonary clinic for further evaluation of right lower lobe lung nodule.  She underwent a CT cardiac scoring to evaluate her coronary artery disease risk factors and was found to have right lower lobe nodule in November 2024.  At that time this measured 10 x 13 mm.  She subsequently had repeat CT chest in February 2025 which redemonstrated the right lower lobe nodule measuring 10 x 8.4 mm.  She had a repeat CT chest in 3 months interval in June 2025 which redemonstrated the proximal left upper cavitary nodule with minimal progression now at 1.6 cm diameter.  A PET scan is scheduled for July.  She denies any respiratory symptoms.  Says her asthma is controlled on Symbicort.  CT chest wo contrast in 08/2024 shows persistent 13x11 mm nodule with no substantial change from prior.   Family history -she denies any family history of lung diseases.  Social history -never smoker denies vaping.  ROS All systems were reviewed and are negative except for the above Objective:   Vitals:   09/20/24 1434  BP: 124/72  Pulse: 86  Temp: 97.8 F (36.6 C)  SpO2: 100%  Weight: 226 lb 6.4 oz (102.7 kg)  Height: 5' 3 (1.6 m)   100% on RA BMI Readings from Last 3 Encounters:  09/20/24 40.10 kg/m  06/02/24 39.01 kg/m   Wt Readings from Last 3 Encounters:  09/20/24 226 lb 6.4 oz (102.7 kg)  06/02/24 220 lb 3.2 oz (99.9 kg)    Physical Exam GEN: NAD, Healthy Appearing HEENT: Supple Neck, Reactive Pupils, EOMI   CVS: Normal S1, Normal S2, RRR, No murmurs or ES appreciated  Lungs: Clear bilateral air entry.  Abdomen: Soft, non tender, non distended, + BS  Extremities: Warm and well perfused, No edema  Skin: No suspicious lesions appreciated  Psych: Normal Affect  Ancillary Information   CBC No results found for: WBC, RBC, HGB, HCT, PLT, MCV, MCH, MCHC, RDW, LYMPHSABS, MONOABS, EOSABS, BASOSABS  Labs and imaging were reviewed.     No data to display           Assessment & Plan:  Amy Gillespie is a pleasant 53 year old female patient with a past medical history of mild persistent asthma, hypertension, hyperlipidemia, type 2 diabetes mellitus presenting today to the pulmonary clinic for further evaluation of right lower lobe lung nodule.  # Right lower lobe nodule Mayo 5.8% probability of malignancy.  Discussed the risk and benefit of proceeding with robotic assisted navigational bronchoscopy including less than 1% risk of hemorrhage and pneumothorax.  She is agreeable with proceeding with the procedure but would like to schedule it in January 2026. Given stability for a year, we agreed to proceed with the procedure on Jan 13th 2026.   []  CT super D 12/20/2024.  []  RANB + EBUS 12/21/2024/  #Mild persistent asthma  []  C/w Sumbicort 80-4.5 2 Puffs BID.  []  Albuterol PRN    RTC 3 months.   I personally spent a total of 40 minutes in the  care of the patient today including preparing to see the patient, getting/reviewing separately obtained history, performing a medically appropriate exam/evaluation, counseling and educating, placing orders, documenting clinical information in the EHR, independently interpreting results, and communicating results.   Darrin Barn, MD [facility] 09/20/2024 3:41 PM

## 2024-10-04 ENCOUNTER — Encounter (HOSPITAL_BASED_OUTPATIENT_CLINIC_OR_DEPARTMENT_OTHER): Payer: Self-pay

## 2024-10-07 ENCOUNTER — Ambulatory Visit (HOSPITAL_BASED_OUTPATIENT_CLINIC_OR_DEPARTMENT_OTHER): Admitting: Cardiology

## 2024-10-07 ENCOUNTER — Encounter (HOSPITAL_BASED_OUTPATIENT_CLINIC_OR_DEPARTMENT_OTHER): Payer: Self-pay | Admitting: Cardiology

## 2024-10-07 VITALS — BP 114/80 | HR 88 | Ht 63.0 in | Wt 225.5 lb

## 2024-10-07 DIAGNOSIS — Z712 Person consulting for explanation of examination or test findings: Secondary | ICD-10-CM | POA: Diagnosis not present

## 2024-10-07 DIAGNOSIS — E7800 Pure hypercholesterolemia, unspecified: Secondary | ICD-10-CM

## 2024-10-07 DIAGNOSIS — I10 Essential (primary) hypertension: Secondary | ICD-10-CM

## 2024-10-07 DIAGNOSIS — E669 Obesity, unspecified: Secondary | ICD-10-CM

## 2024-10-07 DIAGNOSIS — I493 Ventricular premature depolarization: Secondary | ICD-10-CM

## 2024-10-07 DIAGNOSIS — I2510 Atherosclerotic heart disease of native coronary artery without angina pectoris: Secondary | ICD-10-CM | POA: Diagnosis not present

## 2024-10-07 DIAGNOSIS — Z8249 Family history of ischemic heart disease and other diseases of the circulatory system: Secondary | ICD-10-CM | POA: Diagnosis not present

## 2024-10-07 DIAGNOSIS — Z7189 Other specified counseling: Secondary | ICD-10-CM

## 2024-10-07 MED ORDER — ASPIRIN 81 MG PO TBEC
81.0000 mg | DELAYED_RELEASE_TABLET | Freq: Every day | ORAL | Status: AC
Start: 2024-10-07 — End: ?

## 2024-10-07 NOTE — Progress Notes (Signed)
 Cardiology Office Note:  .   Date:  10/07/2024  ID:  Amy Gillespie, DOB 1971/03/06, MRN [id number] PCP: Teresa Channel, MD  [facility] Providers Cardiologist:  Shelda Bruckner, MD {  History of Present Illness: .   Amy Gillespie is a 53 y.o. female with PMH hypertension, prediabetes, hyperlipidemia, obesity. She is seen as a new patient consultation at the request of Dr. Bari for aortic atherosclerosis and hypertension.  Referral from 07/12/24 reviewed. Referral notes aortic atherosclerosis and hypertension. Referral note from office visit 07/08/24 from Dr. Bari notes history of prediabetes, hyperlipidemia, hypertension, obesity, and aortic atherosclerosis. Noted that hypertension was controlled at that visit at 113/78.  Today: Her concerns today are regarding family history and calcium score 10/2023. Ca score 10/2023 was 83.4 (97th %ile). Pulmonary nodules noted, have been followed serially with imaging and PET scan, follows with pulmonary.  FH: Mother was a smoker, had history of hypertension, died in her 20s from MI. Mat gpa died of heart issues in his 7s, had siblings that died young as well. No genetic testing that she knows of. Father was a smoker, had CABG in his late 34s. Also had COPD, alcohol history. Sister has controlled hypertension, on a statin.  Started on atorvastatin after calcium score results. Lipids from 12/2023 show Tchol 138, HDL 89, LDL 37, TG. Had not been told previously that she needed to be on cholesterol medication. At one point told that is was related to PCOS.   Discussed calcium score at length, guidelines and recommendations.  Working on [employer] loss with [employer]. Walks, does aqua exercise.   Reviewed ECG, noted intermittent PVCs. She is asymptomatic.  ROS: Denies chest pain, shortness of breath at rest or with normal exertion. No PND, orthopnea, LE edema or unexpected weight gain. No syncope or palpitations. ROS otherwise negative  except as noted.   Studies Reviewed: SABRA    EKG:  EKG Interpretation Date/Time:  Thursday October 07 2024 08:47:39 EDT Ventricular Rate:  88 PR Interval:  152 QRS Duration:  78 QT Interval:  350 QTC Calculation: 423 R Axis:   15  Text Interpretation: Sinus rhythm with frequent Premature ventricular complexes Cannot rule out Anterior infarct , age undetermined No previous ECGs available Confirmed by Bruckner Shelda [id number]) on 10/07/2024 9:05:43 AM    Physical Exam:   VS:  BP 114/80   Pulse 88   Ht 5' 3 (1.6 m)   Wt 225 lb 8 oz (102.3 kg)   SpO2 99%   BMI 39.95 kg/m    Wt Readings from Last 3 Encounters:  10/07/24 225 lb 8 oz (102.3 kg)  09/20/24 226 lb 6.4 oz (102.7 kg)  06/02/24 220 lb 3.2 oz (99.9 kg)    GEN: Well nourished, well developed in no acute distress HEENT: Normal, moist mucous membranes NECK: No JVD CARDIAC: regular rhythm with occasional premature beat, normal S1 and S2, no rubs or gallops. No murmur. VASCULAR: Radial and DP pulses 2+ bilaterally. No carotid bruits RESPIRATORY:  Clear to auscultation without rales, wheezing or rhonchi  ABDOMEN: Soft, non-tender, non-distended MUSCULOSKELETAL:  Ambulates independently SKIN: Warm and dry, no edema NEUROLOGIC:  Alert and oriented x 3. No focal neuro deficits noted. PSYCHIATRIC:  Normal affect    ASSESSMENT AND PLAN: .    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  CV  risk counseling and prevention -recommend heart healthy/Mediterranean diet, with whole grains, fruits, vegetable, fish, lean meats, nuts, and olive oil. Limit salt. -recommend moderate walking, 3-5 times/week for 30-50 minutes each session. Aim for at least 150 minutes/week. Goal should be pace of 3 miles/hours, or walking 1.5 miles in 30 minutes -recommend avoidance of tobacco products. Avoid excess alcohol.  Dispo: 1 year   Signed, Shelda Bruckner, MD   Shelda Bruckner, MD, PhD, [facility]  [facility]  [place]  [facility] at [facility] at [facility] [address], [address] [place], [place] [address] [phone]

## 2024-10-07 NOTE — Patient Instructions (Signed)
 Medication Instructions:  Your physician has recommended you make the following change in your medication:  1.) start aspirin 81 mg - take one tablet daily  *If you need a refill on your cardiac medications before your next appointment, please call your pharmacy*  Lab Work: Today:  Lp(a)   Testing/Procedures: none  Follow-Up: At [facility], you and your health needs are our priority.  As part of our continuing mission to provide you with exceptional heart care, our providers are all part of one team.  This team includes your primary Cardiologist (physician) and Advanced Practice Providers or APPs (Physician Assistants and Nurse Practitioners) who all work together to provide you with the care you need, when you need it.  Your next appointment:   12 month(s)  Provider:   Shelda Bruckner, MD, Rosaline Bane, NP, or Reche Finder, NP

## 2024-10-09 LAB — LIPOPROTEIN A (LPA): Lipoprotein (a): 77.6 nmol/L — ABNORMAL HIGH (ref <75.0)

## 2024-10-27 ENCOUNTER — Ambulatory Visit (HOSPITAL_BASED_OUTPATIENT_CLINIC_OR_DEPARTMENT_OTHER): Payer: Self-pay | Admitting: Cardiology

## 2024-11-24 ENCOUNTER — Ambulatory Visit: Admitting: Pulmonary Disease

## 2024-11-29 ENCOUNTER — Telehealth: Payer: Self-pay

## 2024-11-29 NOTE — Telephone Encounter (Signed)
 Robotic Bronchoscopy with EBUS 12/21/2024 at 7:30am. Lung Nodule 31627, [id number], [id number]  Amy Gillespie please see bronch info.

## 2024-11-29 NOTE — Telephone Encounter (Signed)
 Lm x1 for the patient.

## 2024-12-13 NOTE — Telephone Encounter (Signed)
 For the codes 68372, [id number], [phone] Prior Auth Not Required Refer # [id number]

## 2024-12-13 NOTE — Telephone Encounter (Signed)
 Noted. Nothing further needed.

## 2024-12-14 ENCOUNTER — Encounter
Admission: RE | Admit: 2024-12-14 | Discharge: 2024-12-14 | Disposition: A | Discharge status: Home / Self Care | Source: Ambulatory Visit | Attending: Pulmonary Disease | Admitting: Pulmonary Disease

## 2024-12-14 ENCOUNTER — Other Ambulatory Visit: Payer: Self-pay

## 2024-12-14 VITALS — Ht 63.0 in | Wt 219.0 lb

## 2024-12-14 DIAGNOSIS — I10 Essential (primary) hypertension: Secondary | ICD-10-CM

## 2024-12-14 DIAGNOSIS — R911 Solitary pulmonary nodule: Secondary | ICD-10-CM

## 2024-12-14 DIAGNOSIS — E119 Type 2 diabetes mellitus without complications: Secondary | ICD-10-CM

## 2024-12-14 HISTORY — DX: Atherosclerotic heart disease of native coronary artery without angina pectoris: I25.10

## 2024-12-14 HISTORY — DX: Solitary pulmonary nodule: R91.1

## 2024-12-14 HISTORY — DX: Obesity, unspecified: E66.9

## 2024-12-14 NOTE — Patient Instructions (Addendum)
 Your procedure is scheduled on: Tuesday 12/21/24 Report to the Registration Desk on the 1st floor of the [facility]. To find out your arrival time, please call [phone] between 1PM - 3PM on: Monday 12/20/24 If your arrival time is 6:00 am, do not arrive before that time as the [facility] entrance doors do not open until 6:00 am.  REMEMBER: Instructions that are not followed completely may result in serious medical risk, up to and including death; or upon the discretion of your surgeon and anesthesiologist your surgery may need to be rescheduled.  Do not eat food or drink fluids after midnight the night before surgery.  No gum chewing or hard candies.  One week prior to surgery: Stop Anti-inflammatories (NSAIDS) such as Advil, Aleve, Ibuprofen, Motrin, Naproxen, Naprosyn and Aspirin  based products such as Excedrin, Goody's Powder, BC Powder. Stop ANY OVER THE COUNTER supplements until after surgery. Iron-FA-B Cmp-C-Biot-Probiotic   You may however, continue to take Tylenol if needed for pain up until the day of surgery.  Stop metFORMIN (GLUCOPHAGE-XR) 500 MG 2 days prior to surgery (take last dose Saturday 12/18/24).  Stop WEGOVY 1 MG/0.5ML SOAJ SQ injection 1 week prior to surgery (Take last dose 12/14/24)  Continue taking all of your other prescription medications up until the day of surgery.  ON THE DAY OF SURGERY ONLY TAKE THESE MEDICATIONS WITH SIPS OF WATER:  esomeprazole (NEXIUM) 20 MG   Use inhalers on the day of surgery and bring to the hospital. albuterol (VENTOLIN HFA) 108 (90 Base) MCG/ACT inhaler  budesonide-formoterol (SYMBICORT) 80-4.5 MCG/ACT inhaler    No Alcohol for 24 hours before or after surgery.  No Smoking including e-cigarettes for 24 hours before surgery.  No chewable tobacco products for at least 6 hours before surgery.  No nicotine patches on the day of surgery.  Do not use any recreational drugs for at least a week (preferably 2 weeks) before  your surgery.  Please be advised that the combination of cocaine and anesthesia may have negative outcomes, up to and including death. If you test positive for cocaine, your surgery will be cancelled.  On the morning of surgery brush your teeth with toothpaste and water, you may rinse your mouth with mouthwash if you wish. Do not swallow any toothpaste or mouthwash.  Use CHG Soap or wipes as directed on instruction sheet.  Do not wear jewelry, make-up, hairpins, clips or nail polish.  For welded (permanent) jewelry: bracelets, anklets, waist bands, etc.  Please have this removed prior to surgery.  If it is not removed, there is a chance that hospital personnel will need to cut it off on the day of surgery.  Do not wear lotions, powders, or perfumes.   Do not shave body hair from the neck down 48 hours before surgery.  Contact lenses, hearing aids and dentures may not be worn into surgery.  Do not bring valuables to the hospital. [facility] is not responsible for any missing/lost belongings or valuables.   Notify your doctor if there is any change in your medical condition (cold, fever, infection).  Wear comfortable clothing (specific to your surgery type) to the hospital.  After surgery, you can help prevent lung complications by doing breathing exercises.  Take deep breaths and cough every 1-2 hours. Your doctor may order a device called an Incentive Spirometer to help you take deep breaths. When coughing or sneezing, hold a pillow firmly against your incision with both hands. This is called splinting. Doing this  helps protect your incision. It also decreases belly discomfort.  If you are being admitted to the hospital overnight, leave your suitcase in the car. After surgery it may be brought to your room.  In case of increased patient census, it may be necessary for you, the patient, to continue your postoperative care in the Same Day Surgery department.  If you are being  discharged the day of surgery, you will not be allowed to drive home. You will need a responsible individual to drive you home and stay with you for 24 hours after surgery.   If you are taking public transportation, you will need to have a responsible individual with you.  Please call the Pre-admissions Testing Dept. at [phone] if you have any questions about these instructions.  Surgery Visitation Policy:  Patients having surgery or a procedure may have two visitors.  Children under the age of 65 must have an adult with them who is not the patient.  Inpatient Visitation:    Visiting hours are 7 a.m. to 8 p.m. Up to four visitors are allowed at one time in a patient room. The visitors may rotate out with other people during the day.  One visitor age 22 or older may stay with the patient overnight and must be in the room by 8 p.m.   Merchandiser, Retail to address health-related social needs:  [email or web address]

## 2024-12-15 ENCOUNTER — Encounter
Admission: RE | Admit: 2024-12-15 | Discharge: 2024-12-15 | Disposition: A | Discharge status: Home / Self Care | Source: Ambulatory Visit | Attending: Pulmonary Disease | Admitting: Pulmonary Disease

## 2024-12-15 DIAGNOSIS — Z01812 Encounter for preprocedural laboratory examination: Secondary | ICD-10-CM | POA: Diagnosis not present

## 2024-12-15 DIAGNOSIS — I10 Essential (primary) hypertension: Secondary | ICD-10-CM | POA: Insufficient documentation

## 2024-12-15 DIAGNOSIS — E119 Type 2 diabetes mellitus without complications: Secondary | ICD-10-CM | POA: Diagnosis not present

## 2024-12-15 DIAGNOSIS — Z01818 Encounter for other preprocedural examination: Secondary | ICD-10-CM | POA: Diagnosis present

## 2024-12-15 LAB — BASIC METABOLIC PANEL WITH GFR
Anion gap: 13 (ref 5–15)
BUN: 10 mg/dL (ref 6–20)
CO2: 24 mmol/L (ref 22–32)
Calcium: 9.7 mg/dL (ref 8.9–10.3)
Chloride: 101 mmol/L (ref 98–111)
Creatinine, Ser: 0.78 mg/dL (ref 0.44–1.00)
GFR, Estimated: >60 mL/min
Glucose, Bld: 78 mg/dL (ref 70–99)
Potassium: 3.8 mmol/L (ref 3.5–5.1)
Sodium: 138 mmol/L (ref 135–145)

## 2024-12-15 LAB — CBC
HCT: 37.6 % (ref 36.0–46.0)
Hemoglobin: 11.9 g/dL — ABNORMAL LOW (ref 12.0–15.0)
MCH: 27.5 pg (ref 26.0–34.0)
MCHC: 31.6 g/dL (ref 30.0–36.0)
MCV: 87 fL (ref 80.0–100.0)
Platelets: 365 K/uL (ref 150–400)
RBC: 4.32 MIL/uL (ref 3.87–5.11)
RDW: 14 % (ref 11.5–15.5)
WBC: 6.6 K/uL (ref 4.0–10.5)
nRBC: 0 % (ref 0.0–0.2)

## 2024-12-15 NOTE — Progress Notes (Signed)
 Last Cardiology visit:   Lonni Slain, MD  Physician Cardiology   Progress Notes    Signed   Encounter Date: 10/07/2024   Signed       Cardiology Office Note:  .   Date:  10/07/2024  ID:  Amy Gillespie, DOB [birth year], MRN [id number] PCP: Teresa Channel, MD  [facility] Providers Cardiologist:  Slain Lonni, MD {   History of Present Illness: .   Amy Gillespie is a 54 y.o. female with PMH hypertension, prediabetes, hyperlipidemia, obesity. She is seen as a new patient consultation at the request of Dr. Bari for aortic atherosclerosis and hypertension.   Referral from 07/12/24 reviewed. Referral notes aortic atherosclerosis and hypertension. Referral note from office visit 07/08/24 from Dr. Bari notes history of prediabetes, hyperlipidemia, hypertension, obesity, and aortic atherosclerosis. Noted that hypertension was controlled at that visit at 113/78.   Today: Her concerns today are regarding family history and calcium score 10/2023. Ca score 10/2023 was 83.4 (97th %ile). Pulmonary nodules noted, have been followed serially with imaging and PET scan, follows with pulmonary.   FH: Mother was a smoker, had history of hypertension, died in her 1s from MI. Mat gpa died of heart issues in his 90s, had siblings that died young as well. No genetic testing that she knows of. Father was a smoker, had CABG in his late 77s. Also had COPD, alcohol history. Sister has controlled hypertension, on a statin.   Started on atorvastatin after calcium score results. Lipids from 12/2023 show Tchol 138, HDL 89, LDL 37, TG. Had not been told previously that she needed to be on cholesterol medication. At one point told that is was related to PCOS.    Discussed calcium score at length, guidelines and recommendations.   Working on [employer] loss with [employer]. Walks, does aqua exercise.    Reviewed ECG, noted intermittent PVCs. She is asymptomatic.   ROS: Denies  chest pain, shortness of breath at rest or with normal exertion. No PND, orthopnea, LE edema or unexpected weight gain. No syncope or palpitations. ROS otherwise negative except as noted.    Studies Reviewed: SABRA     EKG:  EKG Interpretation Date/Time:                  Thursday October 07 2024 08:47:39 EDT Ventricular Rate:         88 PR Interval:                 152 QRS Duration:             78 QT Interval:                 350 QTC Calculation:423 R Axis:                         15   Text Interpretation:Sinus rhythm with frequent Premature ventricular complexes Cannot rule out Anterior infarct , age undetermined No previous ECGs available Confirmed by Lonni Slain [phone]) on 10/07/2024 9:05:43 AM     Physical Exam:   VS:  BP 114/80   Pulse 88   Ht 5' 3 (1.6 m)   Wt 225 lb 8 oz (102.3 kg)   SpO2 99%   BMI 39.95 kg/m       Wt Readings from Last 3 Encounters:  10/07/24 225 lb 8 oz (102.3 kg)  09/20/24 226 lb 6.4 oz (102.7 kg)  06/02/24 220 lb 3.2 oz (99.9  kg)    GEN: Well nourished, well developed in no acute distress HEENT: Normal, moist mucous membranes NECK: No JVD CARDIAC: regular rhythm with occasional premature beat, normal S1 and S2, no rubs or gallops. No murmur. VASCULAR: Radial and DP pulses 2+ bilaterally. No carotid bruits RESPIRATORY:  Clear to auscultation without rales, wheezing or rhonchi  ABDOMEN: Soft, non-tender, non-distended MUSCULOSKELETAL:  Ambulates independently SKIN: Warm and dry, no edema NEUROLOGIC:  Alert and oriented x 3. No focal neuro deficits noted. PSYCHIATRIC:  Normal affect     ASSESSMENT AND PLAN: .     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 Hypercholesterolemia -discussed test results, recommendations, LDL goal <70 -continue atorvastatin 20 mg daily -discussed recommendations for aspirin , she is amenable, discussed watching for side effects -discussed lp(a) today, she is amenable -reviewed red flag  warning signs that need immediate medical attentions   PVCs -note on ECG, asymptomatic -discussed signs/symptoms to watch for   Hypertension -at goal -continue losartan, spironolactone   Obesity, BMI 39 -working with [employer] [facility] [place]   CV risk counseling and prevention -recommend heart healthy/Mediterranean diet, with whole grains, fruits, vegetable, fish, lean meats, nuts, and olive oil. Limit salt. -recommend moderate walking, 3-5 times/week for 30-50 minutes each session. Aim for at least 150 minutes/week. Goal should be pace of 3 miles/hours, or walking 1.5 miles in 30 minutes -recommend avoidance of tobacco products. Avoid excess alcohol.   Dispo: 1 year    Signed, Shelda Bruckner, MD   Shelda Bruckner, MD, PhD, [facility] [place]  [facility]  [place]  [facility] at [facility] at [facility] [address], [address] [place], [place] [address] [phone]          Electronically signed by Bruckner Shelda, MD at 10/07/2024  6:15 PM     Office Visit on 10/07/2024       Detailed Report      Note shared with patient Additional Documentation  Vitals: BP 114/80   Pulse 88   Ht 5' 3 (1.6 m)   Wt 102.3 kg   SpO2 99%   BMI 39.95 kg/m   BSA 2.13 m  Flowsheets: Patient-Reported Data,   ...(5 more)  Encounter Info: Billing Info,   History,   Allergies,   Detailed Report   Orders Placed   Lipoprotein A (LPA)  EKG 12-Lead All Encounter Results Medication Changes   Aspirin  81 mg Oral Daily, Swallow whole. Medication List Visit Diagnoses   Nonobstructive atherosclerosis of coronary artery  Encounter to discuss test results  Pure hypercholesterolemia  Family history of premature coronary artery disease  PVC (premature ventricular contraction)  Essential hypertension  Obesity (BMI 30-39.9)  Cardiac risk counseling  Counseling on health promotion and  disease prevention

## 2024-12-20 ENCOUNTER — Ambulatory Visit
Admission: RE | Admit: 2024-12-20 | Discharge: 2024-12-20 | Disposition: A | Discharge status: Home / Self Care | Source: Ambulatory Visit | Attending: Pulmonary Disease | Admitting: Pulmonary Disease

## 2024-12-20 DIAGNOSIS — R911 Solitary pulmonary nodule: Secondary | ICD-10-CM | POA: Insufficient documentation

## 2024-12-21 ENCOUNTER — Ambulatory Visit
Admission: RE | Admit: 2024-12-21 | Discharge: 2024-12-21 | Disposition: A | Discharge status: Home / Self Care | Attending: Pulmonary Disease | Admitting: Pulmonary Disease

## 2024-12-21 ENCOUNTER — Ambulatory Visit

## 2024-12-21 ENCOUNTER — Other Ambulatory Visit: Payer: Self-pay

## 2024-12-21 ENCOUNTER — Encounter: Admission: RE | Disposition: A | Payer: Self-pay | Source: Home / Self Care | Attending: Pulmonary Disease

## 2024-12-21 ENCOUNTER — Encounter: Payer: Self-pay | Admitting: Pulmonary Disease

## 2024-12-21 DIAGNOSIS — E785 Hyperlipidemia, unspecified: Secondary | ICD-10-CM | POA: Diagnosis not present

## 2024-12-21 DIAGNOSIS — K219 Gastro-esophageal reflux disease without esophagitis: Secondary | ICD-10-CM | POA: Diagnosis not present

## 2024-12-21 DIAGNOSIS — E119 Type 2 diabetes mellitus without complications: Secondary | ICD-10-CM | POA: Diagnosis not present

## 2024-12-21 DIAGNOSIS — J4530 Mild persistent asthma, uncomplicated: Secondary | ICD-10-CM | POA: Diagnosis not present

## 2024-12-21 DIAGNOSIS — I10 Essential (primary) hypertension: Secondary | ICD-10-CM | POA: Diagnosis not present

## 2024-12-21 DIAGNOSIS — R911 Solitary pulmonary nodule: Secondary | ICD-10-CM | POA: Diagnosis not present

## 2024-12-21 DIAGNOSIS — C3431 Malignant neoplasm of lower lobe, right bronchus or lung: Secondary | ICD-10-CM | POA: Insufficient documentation

## 2024-12-21 HISTORY — PX: VIDEO BRONCHOSCOPY WITH ENDOBRONCHIAL NAVIGATION: SHX6175

## 2024-12-21 HISTORY — PX: ENDOBRONCHIAL ULTRASOUND: SHX5096

## 2024-12-21 LAB — POCT PREGNANCY, URINE: Preg Test, Ur: NEGATIVE

## 2024-12-21 LAB — GLUCOSE, CAPILLARY
Glucose-Capillary: 83 mg/dL (ref 70–99)
Glucose-Capillary: 97 mg/dL (ref 70–99)

## 2024-12-21 MED ORDER — DROPERIDOL 2.5 MG/ML IJ SOLN: 0.6250 mg | Freq: Once | INTRAMUSCULAR | Status: DC | PRN

## 2024-12-21 MED ORDER — DEXMEDETOMIDINE HCL IN NACL 80 MCG/20ML IV SOLN
INTRAVENOUS | Status: AC
  Filled 2024-12-21: qty 20

## 2024-12-21 MED ORDER — SUGAMMADEX SODIUM 200 MG/2ML IV SOLN
INTRAVENOUS | Status: DC | PRN
  Administered 2024-12-21 (×7): 50 mg via INTRAVENOUS

## 2024-12-21 MED ORDER — LIDOCAINE HCL (CARDIAC) PF 100 MG/5ML IV SOSY
PREFILLED_SYRINGE | INTRAVENOUS | Status: DC | PRN
  Administered 2024-12-21: 60 mg via INTRAVENOUS
  Administered 2024-12-21: 40 mg via INTRAVENOUS

## 2024-12-21 MED ORDER — ROCURONIUM BROMIDE 10 MG/ML (PF) SYRINGE
PREFILLED_SYRINGE | INTRAVENOUS | Status: AC
  Filled 2024-12-21: qty 10

## 2024-12-21 MED ORDER — ONDANSETRON HCL 4 MG/2ML IJ SOLN
INTRAMUSCULAR | Status: AC
  Filled 2024-12-21: qty 2

## 2024-12-21 MED ORDER — ONDANSETRON HCL 4 MG/2ML IJ SOLN
INTRAMUSCULAR | Status: DC | PRN
  Administered 2024-12-21: 4 mg via INTRAVENOUS

## 2024-12-21 MED ORDER — MIDAZOLAM HCL 2 MG/2ML IJ SOLN
INTRAMUSCULAR | Status: AC
  Filled 2024-12-21: qty 2

## 2024-12-21 MED ORDER — ORAL CARE MOUTH RINSE: 15.0000 mL | Freq: Once | OROMUCOSAL | Status: AC

## 2024-12-21 MED ORDER — DEXAMETHASONE SOD PHOSPHATE PF 10 MG/ML IJ SOLN
INTRAMUSCULAR | Status: AC
  Filled 2024-12-21: qty 1

## 2024-12-21 MED ORDER — CHLORHEXIDINE GLUCONATE 0.12 % MT SOLN
OROMUCOSAL | Status: AC
  Filled 2024-12-21: qty 15

## 2024-12-21 MED ORDER — PROPOFOL 1000 MG/100ML IV EMUL
INTRAVENOUS | Status: AC
  Filled 2024-12-21: qty 100

## 2024-12-21 MED ORDER — MIDAZOLAM HCL (PF) 2 MG/2ML IJ SOLN
INTRAMUSCULAR | Status: DC | PRN
  Administered 2024-12-21: 2 mg via INTRAVENOUS

## 2024-12-21 MED ORDER — CHLORHEXIDINE GLUCONATE 0.12 % MT SOLN
15.0000 mL | Freq: Once | OROMUCOSAL | Status: AC
  Administered 2024-12-21: 15 mL via OROMUCOSAL

## 2024-12-21 MED ORDER — DEXAMETHASONE SOD PHOSPHATE PF 10 MG/ML IJ SOLN
INTRAMUSCULAR | Status: DC | PRN
  Administered 2024-12-21: 10 mg via INTRAVENOUS

## 2024-12-21 MED ORDER — LIDOCAINE HCL (PF) 2 % IJ SOLN
INTRAMUSCULAR | Status: AC
  Filled 2024-12-21: qty 5

## 2024-12-21 MED ORDER — PROPOFOL 10 MG/ML IV BOLUS
INTRAVENOUS | Status: DC | PRN
  Administered 2024-12-21: 150 ug/kg/min via INTRAVENOUS

## 2024-12-21 MED ORDER — ROCURONIUM BROMIDE 100 MG/10ML IV SOLN
INTRAVENOUS | Status: DC | PRN
  Administered 2024-12-21: 40 mg via INTRAVENOUS
  Administered 2024-12-21 (×2): 10 mg via INTRAVENOUS

## 2024-12-21 MED ORDER — SODIUM CHLORIDE 0.9 % IV SOLN: INTRAVENOUS | Status: DC

## 2024-12-21 MED ORDER — FENTANYL CITRATE (PF) 100 MCG/2ML IJ SOLN: 25.0000 ug | INTRAMUSCULAR | Status: DC | PRN

## 2024-12-21 MED ORDER — DEXMEDETOMIDINE HCL IN NACL 80 MCG/20ML IV SOLN
INTRAVENOUS | Status: DC | PRN
  Administered 2024-12-21: 8 ug via INTRAVENOUS
  Administered 2024-12-21: 12 ug via INTRAVENOUS

## 2024-12-21 NOTE — H&P (Signed)
 Amy Gillespie is a pleasant 54 year old female patient with a past medical history of mild persistent asthma, hypertension, hyperlipidemia, type 2 diabetes mellitus presenting today for a robotic assisted navigational bronchoscopy of right lower lobe lung nodule.   # Right lower lobe nodule Mayo 5.8% probability of malignancy.  Discussed the risk and benefit of proceeding with robotic assisted navigational bronchoscopy including less than 1% risk of hemorrhage and pneumothorax.  She is agreeable with proceeding with the procedure.   Gen: NAD  CVS: Normal S1, Normal S2, RRR  Lungs: Clear bilat air entry  Abdomen: Soft, non tender,  Extremities Warm and well perfused.   Patient is adequate for the procedure.   Darrin Barn, MD [facility] 12/21/2024 7:41 AM

## 2024-12-21 NOTE — Anesthesia Preprocedure Evaluation (Signed)
"                                    Anesthesia Evaluation  Patient identified by MRN, date of birth, ID band Patient awake    Reviewed: Allergy & Precautions, H&P , NPO status , Patient's Chart, lab work & pertinent test results, reviewed documented beta blocker date and time   History of Anesthesia Complications Negative for: history of anesthetic complications  Airway Mallampati: II  TM Distance: >3 FB Neck ROM: full    Dental  (+) Dental Advidsory Given, Caps   Pulmonary neg shortness of breath, asthma , neg sleep apnea, neg COPD, neg recent URI   Pulmonary exam normal breath sounds clear to auscultation       Cardiovascular Exercise Tolerance: Good hypertension, (-) angina (-) Past MI and (-) Cardiac Stents Normal cardiovascular exam(-) dysrhythmias (-) Valvular Problems/Murmurs Rhythm:regular Rate:Normal     Neuro/Psych negative neurological ROS  negative psych ROS   GI/Hepatic Neg liver ROS,GERD  Medicated and Controlled,,  Endo/Other  diabetes (borderline)    Renal/GU negative Renal ROS  negative genitourinary   Musculoskeletal   Abdominal   Peds  Hematology negative hematology ROS (+)   Anesthesia Other Findings Past Medical History: No date: Allergies No date: Asthma No date: DM (diabetes mellitus), type 2 (HCC) No date: GERD (gastroesophageal reflux disease) No date: Hyperlipidemia No date: Hypertension No date: Lung nodule No date: Nonobstructive atherosclerosis of coronary artery No date: Obesity (BMI 30-39.9) No date: Polycystic ovaries No date: Right lower lobe pulmonary nodule   Reproductive/Obstetrics negative OB ROS                              Anesthesia Physical Anesthesia Plan  ASA: 2  Anesthesia Plan: General   Post-op Pain Management:    Induction: Intravenous  PONV Risk Score and Plan: 3 and Ondansetron , Dexamethasone , Treatment may vary due to age or medical condition and  Midazolam   Airway Management Planned: Oral ETT  Additional Equipment:   Intra-op Plan:   Post-operative Plan: Extubation in OR  Informed Consent: I have reviewed the patients History and Physical, chart, labs and discussed the procedure including the risks, benefits and alternatives for the proposed anesthesia with the patient or authorized representative who has indicated his/her understanding and acceptance.     Dental Advisory Given  Plan Discussed with: Anesthesiologist, CRNA and Surgeon  Anesthesia Plan Comments:         Anesthesia Quick Evaluation  "

## 2024-12-21 NOTE — Op Note (Signed)
 Video Bronchoscopy with Robotic Assisted Bronchoscopic Navigation   Date of Operation: 12/21/2024   Pre-op Diagnosis: Lung Nodule  Post-op Diagnosis: Lung Nodule  Surgeon: Afsana Liera  Anesthesia: General endotracheal anesthesia  Operation: Flexible video fiberoptic bronchoscopy with robotic assistance and biopsies.  Estimated Blood Loss: Minimal  Complications: None  Indications and History: Amy Gillespie is a 54 y.o. female with history of RLL Nodule.  Recommendation made to achieve a tissue diagnosis via robotic assisted navigational bronchoscopy.  The risks, benefits, complications, treatment options and expected outcomes were discussed with the patient.  The possibilities of pneumothorax, pneumonia, reaction to medication, pulmonary aspiration, perforation of a viscus, bleeding, failure to diagnose a condition and creating a complication requiring transfusion or operation were discussed with the patient who freely signed the consent.    Description of Procedure: The patient was seen in the Preoperative Area, was examined and was deemed appropriate to proceed.  The patient was taken to [facility] Endoscopy room 3, identified as Amy Gillespie and the procedure verified as Flexible Video Fiberoptic Bronchoscopy.  A Time Out was held and the above information confirmed.   Prior to the date of the procedure a high-resolution CT scan of the chest was performed. Utilizing ION software program a virtual tracheobronchial tree was generated to allow the creation of distinct navigation pathways to the patient's parenchymal abnormalities. After being taken to the operating room general anesthesia was initiated and the patient  was orally intubated. The video fiberoptic bronchoscope was introduced via the endotracheal tube and a general inspection was performed which showed normal right and left lung anatomy. Aspiration of the bilateral mainstems was completed to remove any remaining secretions. Robotic  catheter inserted into patient's endotracheal tube.   Target #1 RLL Nodule: The distinct navigation pathways prepared prior to this procedure were then utilized to navigate to patient's lesion identified on CT scan. The robotic catheter was secured into place and the vision probe was withdrawn.  Lesion location was approximated using fluoroscopy.  Local registration and targeting was performed using GE mobile C-arm three-dimensional imaging and REBUS. Under fluoroscopic guidance transbronchial needle biopsies, and transbronchial forceps biopsies were performed to be sent for cytology and pathology.  Needle-in-lesion was confirmed using GE mobile C-arm.  A bronchioalveolar lavage was performed in the RLL and sent for cytology.   EBUS Survey of 4L, 4R, 7, 11Rs and 11Ri did not reveal any abnormal lymph nodes. All nodes were < 0.5cm.   At the end of the procedure a general airway inspection was performed and there was no evidence of active bleeding. The bronchoscope was removed.  The patient tolerated the procedure well. There was no significant blood loss and there were no obvious complications. A post-procedural chest x-ray is pending.  Samples Target #1: 1. Transbronchial needle biopsies from RLL Nodule 2. Transbronchial forceps biopsies from RLL Nodule. 3. Bronchoalveolar lavage from RLL.  Plans:  The patient will be discharged from the PACU to home when recovered from anesthesia and after chest x-ray is reviewed. We will review the cytology, pathology and microbiology results with the patient when they become available. Outpatient followup will be with Chia Mowers.  Darrin Barn, MD [facility] 12/21/2024 9:17 AM

## 2024-12-21 NOTE — Anesthesia Procedure Notes (Signed)
 Procedure Name: Intubation Date/Time: 12/21/2024 8:09 AM  Performed by: Governor Selinda NOVAK, CRNAPre-anesthesia Checklist: Patient identified, Emergency Drugs available, Suction available and Patient being monitored Patient Re-evaluated:Patient Re-evaluated prior to induction Oxygen Delivery Method: Circle system utilized Preoxygenation: Pre-oxygenation with 100% oxygen Induction Type: IV induction Ventilation: Mask ventilation without difficulty Laryngoscope Size: McGrath and 3 Grade View: Grade I Tube type: Oral Tube size: 8.5 mm Number of attempts: 1 Placement Confirmation: ETT inserted through vocal cords under direct vision, positive ETCO2 and breath sounds checked- equal and bilateral (Bronchoscopy) Secured at: 21 cm Tube secured with: Tape Dental Injury: Teeth and Oropharynx as per pre-operative assessment  Difficulty Due To: Difficult Airway-  due to edematous airway

## 2024-12-21 NOTE — Transfer of Care (Signed)
 Immediate Anesthesia Transfer of Care Note  Patient: Amy Gillespie  Procedure(s) Performed: VIDEO BRONCHOSCOPY WITH ENDOBRONCHIAL NAVIGATION (Right) ENDOBRONCHIAL ULTRASOUND (EBUS) (Bilateral)  Patient Location: PACU  Anesthesia Type:General  Level of Consciousness: awake and alert   Airway & Oxygen Therapy: Patient Spontanous Breathing and Patient connected to nasal cannula oxygen  Post-op Assessment: Report given to RN and Post -op Vital signs reviewed and stable  Post vital signs: stable  Last Vitals:  Vitals Value Taken Time  BP 108/77 12/21/24 09:32  Temp    Pulse 96 12/21/24 09:43  Resp 31 12/21/24 09:43  SpO2 100 % 12/21/24 09:43  Vitals shown include unfiled device data.  Last Pain:  Vitals:   12/21/24 0702  TempSrc: Temporal  PainSc: 0-No pain         Complications: There were no known notable events for this encounter.

## 2024-12-21 NOTE — Anesthesia Postprocedure Evaluation (Signed)
"   Anesthesia Post Note  Patient: Amy Gillespie  Procedure(s) Performed: VIDEO BRONCHOSCOPY WITH ENDOBRONCHIAL NAVIGATION (Right) ENDOBRONCHIAL ULTRASOUND (EBUS) (Bilateral)  Patient location during evaluation: PACU Anesthesia Type: General Level of consciousness: awake and alert Pain management: pain level controlled Vital Signs Assessment: post-procedure vital signs reviewed and stable Respiratory status: spontaneous breathing, nonlabored ventilation, respiratory function stable and patient connected to nasal cannula oxygen Cardiovascular status: blood pressure returned to baseline and stable Postop Assessment: no apparent nausea or vomiting Anesthetic complications: no   There were no known notable events for this encounter.   Last Vitals:  Vitals:   12/21/24 1000 12/21/24 1016  BP: 107/77 110/87  Pulse: 99 90  Resp: 16 18  Temp:  36.5 C  SpO2: 100% 99%    Last Pain:  Vitals:   12/21/24 1016  TempSrc: Temporal  PainSc: 0-No pain                 Prentice Murphy      "

## 2024-12-21 NOTE — Op Note (Signed)
" ° °  RLL Nodule Tool-in-Lesion  Darrin Barn, MD [facility] 12/21/2024 9:20 AM   "

## 2024-12-22 ENCOUNTER — Encounter: Payer: Self-pay | Admitting: Pulmonary Disease

## 2024-12-23 ENCOUNTER — Telehealth: Payer: Self-pay | Admitting: Pulmonary Disease

## 2024-12-23 ENCOUNTER — Telehealth: Payer: Self-pay

## 2024-12-23 DIAGNOSIS — R911 Solitary pulmonary nodule: Secondary | ICD-10-CM

## 2024-12-23 LAB — CYTOLOGY - NON PAP

## 2024-12-23 LAB — SURGICAL PATHOLOGY

## 2024-12-23 NOTE — Telephone Encounter (Signed)
 Called and discussed the results of Ms. 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   Referral to CTS and Heme/Onc placed.   Darrin Barn, MD [facility] 12/23/2024 2:17 PM

## 2024-12-23 NOTE — Telephone Encounter (Signed)
 Noted. Nothing further needed.

## 2024-12-23 NOTE — Telephone Encounter (Signed)
 Copied from CRM [id number]. Topic: Clinical - Lab/Test Results >> Dec 23, 2024  1:44 PM Corean SAUNDERS wrote: Reason for CRM: Patient states she missed two calls from the clinic and she states she believes its regarding bronchoscopy results but there is not a note in the chart regarding this , please call patient back to advise.

## 2024-12-23 NOTE — Addendum Note (Signed)
 Addended by: MALKA DOMINO on: 12/23/2024 02:18 PM   Modules accepted: Orders

## 2024-12-24 LAB — CULTURE, RESPIRATORY W GRAM STAIN
Culture: NO GROWTH
Gram Stain: NONE SEEN

## 2024-12-29 ENCOUNTER — Ambulatory Visit

## 2024-12-29 DIAGNOSIS — R911 Solitary pulmonary nodule: Secondary | ICD-10-CM | POA: Diagnosis not present

## 2024-12-29 LAB — PULMONARY FUNCTION TEST
DL/VA % pred: 127 %
DL/VA: 5.48 ml/min/mmHg/L
DLCO cor % pred: 134 %
DLCO cor: 27.12 ml/min/mmHg
DLCO unc % pred: 127 %
DLCO unc: 25.78 ml/min/mmHg
FEF 25-75 Post: 3.92 L/s
FEF 25-75 Pre: 3.73 L/s
FEF2575-%Change-Post: 5 %
FEF2575-%Pred-Post: 147 %
FEF2575-%Pred-Pre: 140 %
FEV1-%Change-Post: 1 %
FEV1-%Pred-Post: 106 %
FEV1-%Pred-Pre: 104 %
FEV1-Post: 2.85 L
FEV1-Pre: 2.82 L
FEV1FVC-%Change-Post: 0 %
FEV1FVC-%Pred-Pre: 105 %
FEV6-%Change-Post: 0 %
FEV6-%Pred-Post: 101 %
FEV6-%Pred-Pre: 101 %
FEV6-Post: 3.36 L
FEV6-Pre: 3.35 L
FEV6FVC-%Change-Post: 0 %
FEV6FVC-%Pred-Post: 102 %
FEV6FVC-%Pred-Pre: 102 %
FVC-%Change-Post: 0 %
FVC-%Pred-Post: 98 %
FVC-%Pred-Pre: 98 %
FVC-Post: 3.36 L
FVC-Pre: 3.35 L
Post FEV1/FVC ratio: 85 %
Post FEV6/FVC ratio: 100 %
Pre FEV1/FVC ratio: 84 %
Pre FEV6/FVC Ratio: 100 %
RV % pred: 92 %
RV: 1.63 L
TLC % pred: 105 %
TLC: 5.16 L

## 2024-12-29 NOTE — Patient Instructions (Signed)
 Full PFT completed today ? ?

## 2024-12-29 NOTE — Progress Notes (Signed)
 Full PFT completed today ? ?

## 2025-01-03 ENCOUNTER — Inpatient Hospital Stay: Admitting: Internal Medicine

## 2025-01-03 ENCOUNTER — Inpatient Hospital Stay

## 2025-01-10 ENCOUNTER — Inpatient Hospital Stay: Admitting: Internal Medicine

## 2025-01-10 ENCOUNTER — Inpatient Hospital Stay

## 2025-01-11 LAB — CULTURE, FUNGUS WITHOUT SMEAR

## 2025-01-12 ENCOUNTER — Inpatient Hospital Stay

## 2025-01-12 ENCOUNTER — Other Ambulatory Visit: Payer: Self-pay

## 2025-01-12 ENCOUNTER — Inpatient Hospital Stay: Admitting: Internal Medicine

## 2025-01-12 DIAGNOSIS — R911 Solitary pulmonary nodule: Secondary | ICD-10-CM

## 2025-01-13 ENCOUNTER — Inpatient Hospital Stay

## 2025-01-13 ENCOUNTER — Inpatient Hospital Stay: Admitting: Internal Medicine

## 2025-01-13 VITALS — BP 122/83 | HR 93 | Temp 97.3°F | Resp 17 | Ht 63.0 in | Wt 216.6 lb

## 2025-01-13 DIAGNOSIS — R911 Solitary pulmonary nodule: Secondary | ICD-10-CM

## 2025-01-13 DIAGNOSIS — C3431 Malignant neoplasm of lower lobe, right bronchus or lung: Secondary | ICD-10-CM | POA: Insufficient documentation

## 2025-01-13 LAB — CMP (CANCER CENTER ONLY)
ALT: 14 U/L (ref 0–44)
AST: 22 U/L (ref 15–41)
Albumin: 4.3 g/dL (ref 3.5–5.0)
Alkaline Phosphatase: 74 U/L (ref 38–126)
Anion gap: 12 (ref 5–15)
BUN: 6 mg/dL (ref 6–20)
CO2: 24 mmol/L (ref 22–32)
Calcium: 9.5 mg/dL (ref 8.9–10.3)
Chloride: 104 mmol/L (ref 98–111)
Creatinine: 0.74 mg/dL (ref 0.44–1.00)
GFR, Estimated: >60 mL/min
Glucose, Bld: 100 mg/dL — ABNORMAL HIGH (ref 70–99)
Potassium: 3.7 mmol/L (ref 3.5–5.1)
Sodium: 140 mmol/L (ref 135–145)
Total Bilirubin: 0.5 mg/dL (ref 0.0–1.2)
Total Protein: 7.6 g/dL (ref 6.5–8.1)

## 2025-01-13 LAB — CBC WITH DIFFERENTIAL (CANCER CENTER ONLY)
Abs Immature Granulocytes: 0.02 10*3/uL (ref 0.00–0.07)
Basophils Absolute: 0.1 10*3/uL (ref 0.0–0.1)
Basophils Relative: 2 %
Eosinophils Absolute: 0.3 10*3/uL (ref 0.0–0.5)
Eosinophils Relative: 4 %
HCT: 36.3 % (ref 36.0–46.0)
Hemoglobin: 11.6 g/dL — ABNORMAL LOW (ref 12.0–15.0)
Immature Granulocytes: 0 %
Lymphocytes Relative: 31 %
Lymphs Abs: 1.9 10*3/uL (ref 0.7–4.0)
MCH: 27.7 pg (ref 26.0–34.0)
MCHC: 32 g/dL (ref 30.0–36.0)
MCV: 86.6 fL (ref 80.0–100.0)
Monocytes Absolute: 0.5 10*3/uL (ref 0.1–1.0)
Monocytes Relative: 8 %
Neutro Abs: 3.5 10*3/uL (ref 1.7–7.7)
Neutrophils Relative %: 55 %
Platelet Count: 413 10*3/uL — ABNORMAL HIGH (ref 150–400)
RBC: 4.19 MIL/uL (ref 3.87–5.11)
RDW: 14.7 % (ref 11.5–15.5)
WBC Count: 6.2 10*3/uL (ref 4.0–10.5)
nRBC: 0 % (ref 0.0–0.2)

## 2025-01-13 NOTE — Progress Notes (Signed)
 "    [facility] [phone]   [phone]) [phone]  CONSULT NOTE  REFERRING PHYSICIAN: Dr. Darrin Barn  REASON FOR CONSULTATION:  54 years old African-American female diagnosed with lung cancer  HPI Amy Gillespie is a 54 y.o. female with past medical history significant for diabetes mellitus, hypertension, dyslipidemia, GERD, asthma, polycystic ovary.  The patient has a known right lower lobe pulmonary nodule since November 2024 when she had CT cardiac scoring on October 21, 2023 and it showed pulmonary nodules measuring up to 1.0 x 1.3 cm in the right lower lobe.  Was followed closely by several imaging studies and CT super D of the chest on 06/07/2024 showed the dominant right lower lobe nodule did not have significant increase in size and a PET scan performed on the same day showed no significant FDG activity.  She continued on observation and repeat CT sober the of the chest on December 20, 2024 showed stable 1.3 x 0.9 cm paraspinal right lower lobe pulmonary nodule as well as stable 0.2 cm peripheral right lung nodule.  On December 21, 2024 the patient underwent redo bronchoscopy with robotic assisted bronchoscopic navigation under the care of Dr. Barn.  The final cytology ([id number]) from the right lower lobe lung nodule was consistent with non-small cell carcinoma, favoring adenocarcinoma.  The patient was referred to me today for evaluation and recommendation regarding treatment of her condition.  HPI  Discussed the use of AI scribe software for clinical note transcription with the patient, who gave verbal consent to proceed.  History of Present Illness Amy Gillespie is a 54 year old woman with newly diagnosed stage 1A right lower lobe lung adenocarcinoma who presents for initial hematology/oncology consultation to discuss management options.  A right lower lobe lung nodule measuring 1.3 x 1.0 cm was incidentally identified on cardiac CT in  November 2024. Serial imaging, including PET scan in June 2025, showed no metabolic activity, and surveillance CT in January 2026 demonstrated a stable, persistent nodule. Bronchoscopy subsequently confirmed lung adenocarcinoma. Pulmonary function testing performed two weeks prior to this visit was normal.  She has not received any prior oncologic treatment and remains asymptomatic. She denies chest pain, dyspnea, cough, hemoptysis, constitutional symptoms, or palpable lymphadenopathy. She feels physically well and has no new or concerning symptoms.  Her medical history includes asthma, hypertension, type 2 diabetes mellitus, hyperlipidemia, polycystic ovary syndrome, and gastroesophageal reflux disease. She has never smoked and has no history of cardiac events or stroke. Family history significant for mother with asthma, heart disease and hyper tension.  Father had COPD and hypertension and a half sister had breast cancer. The patient is single and has no children.  She works as a clinical biochemist.  She has no history of smoking but drinks alcohol occasionally and no history of drug abuse.     Past Medical History:  Diagnosis Date   Allergies    Asthma    DM (diabetes mellitus), type 2 (HCC)    GERD (gastroesophageal reflux disease)    Hyperlipidemia    Hypertension    Lung nodule    Nonobstructive atherosclerosis of coronary artery    Obesity (BMI 30-39.9)    Polycystic ovaries    Right lower lobe pulmonary nodule       Past Surgical History:  Procedure Laterality Date   ENDOBRONCHIAL ULTRASOUND Bilateral 12/21/2024   Procedure: ENDOBRONCHIAL ULTRASOUND (EBUS);  Surgeon: Barn Darrin, MD;  Location: [facility];  Service: Pulmonary;  Laterality: Bilateral;   VIDEO  BRONCHOSCOPY WITH ENDOBRONCHIAL NAVIGATION Right 12/21/2024   Procedure: VIDEO BRONCHOSCOPY WITH ENDOBRONCHIAL NAVIGATION;  Surgeon: Malka Domino, MD;  Location: [facility];  Service: Pulmonary;  Laterality: Right;    WISDOM TOOTH EXTRACTION      Family History  Problem Relation Age of Onset   Asthma Mother    Heart attack Mother    Hypertension Mother    COPD Father    Heart disease Father    Hypertension Father    Breast cancer Half-Sister     Social History Social History[1]  Allergies[2]  Current Outpatient Medications  Medication Sig Dispense Refill   albuterol (VENTOLIN HFA) 108 (90 Base) MCG/ACT inhaler Inhale 2 puffs into the lungs every 4 (four) hours as needed.     aspirin  EC 81 MG tablet Take 1 tablet (81 mg total) by mouth daily. Swallow whole.     atorvastatin (LIPITOR) 20 MG tablet Take 20 mg by mouth daily.     betamethasone dipropionate (DIPROLENE) 0.05 % ointment Apply topically 2 (two) times daily.     budesonide-formoterol (SYMBICORT) 80-4.5 MCG/ACT inhaler Inhale 2 puffs into the lungs in the morning and at bedtime.     ENPRESSE-28 50-30/75-40/ 125-30 MCG TABS Take 1 tablet by mouth daily.     EPINEPHrine 0.3 mg/0.3 mL IJ SOAJ injection Inject 0.3 mg into the muscle as needed.     esomeprazole (NEXIUM) 20 MG capsule Take 20 mg by mouth daily.     fluocinonide ointment (LIDEX) 0.05 % Apply 1 Application topically as needed.     Iron-FA-B Cmp-C-Biot-Probiotic (FUSION PLUS) CAPS Take 1 capsule by mouth daily.     ketotifen (ZADITOR) 0.035 % ophthalmic solution Apply 1 drop to eye as needed.     levocetirizine (XYZAL) 5 MG tablet Take 5 mg by mouth as needed.     losartan (COZAAR) 25 MG tablet Take 25 mg by mouth daily.     metFORMIN (GLUCOPHAGE-XR) 500 MG 24 hr tablet Take 100 mg by mouth daily.     montelukast (SINGULAIR) 10 MG tablet Take 10 mg by mouth daily.     spironolactone (ALDACTONE) 50 MG tablet Take 50 mg by mouth daily.     triamcinolone cream (KENALOG) 0.5 % Apply 1 Application topically as needed.     WEGOVY 1 MG/0.5ML SOAJ SQ injection Inject 1 mg into the skin once a week.     No current facility-administered medications for this visit.    Review of  Systems  Constitutional: negative Eyes: negative Ears, nose, mouth, throat, and face: negative Respiratory: negative Cardiovascular: negative Gastrointestinal: negative Genitourinary:negative Integument/breast: negative Hematologic/lymphatic: negative Musculoskeletal:negative Neurological: negative Behavioral/Psych: negative Endocrine: negative Allergic/Immunologic: negative  Physical Exam  MJO:jozmu, healthy, no distress, well nourished, well developed, and anxious SKIN: skin color, texture, turgor are normal, no rashes or significant lesions HEAD: Normocephalic, No masses, lesions, tenderness or abnormalities EYES: normal, PERRLA, Conjunctiva are pink and non-injected EARS: External ears normal, Canals clear OROPHARYNX:no exudate, no erythema, and lips, buccal mucosa, and tongue normal  NECK: supple, no adenopathy, no JVD LYMPH:  no palpable lymphadenopathy, no hepatosplenomegaly BREAST:not examined LUNGS: clear to auscultation , and palpation HEART: regular rate & rhythm, no murmurs, and no gallops ABDOMEN:abdomen soft, non-tender, normal bowel sounds, and no masses or organomegaly BACK: Back symmetric, no curvature., No CVA tenderness EXTREMITIES:no joint deformities, effusion, or inflammation, no edema  NEURO: alert & oriented x 3 with fluent speech, no focal motor/sensory deficits  PERFORMANCE STATUS: ECOG 0  LABORATORY DATA: Lab Results  Component Value Date   WBC 6.6 12/15/2024   HGB 11.9 (L) 12/15/2024   HCT 37.6 12/15/2024   MCV 87.0 12/15/2024   PLT 365 12/15/2024      Chemistry      Component Value Date/Time   NA 138 12/15/2024 1100   K 3.8 12/15/2024 1100   CL 101 12/15/2024 1100   CO2 24 12/15/2024 1100   BUN 10 12/15/2024 1100   CREATININE 0.78 12/15/2024 1100      Component Value Date/Time   CALCIUM 9.7 12/15/2024 1100       RADIOGRAPHIC STUDIES: CT SUPER D CHEST WO CONTRAST Result Date: 12/27/2024 CLINICAL DATA:  Pulmonary nodule.  EXAM: CT CHEST WITHOUT CONTRAST TECHNIQUE: Multidetector CT imaging of the chest was performed using thin slice collimation for electromagnetic bronchoscopy planning purposes, without intravenous contrast. RADIATION DOSE REDUCTION: This exam was performed according to the departmental dose-optimization program which includes automated exposure control, adjustment of the mA and/or kV according to patient size and/or use of iterative reconstruction technique. COMPARISON:  09/07/2024 FINDINGS: Cardiovascular: The heart size is normal. No substantial pericardial effusion. Coronary artery calcification is evident. Mild atherosclerotic calcification is noted in the wall of the thoracic aorta. Mediastinum/Nodes: No mediastinal lymphadenopathy. No evidence for gross hilar lymphadenopathy although assessment is limited by the lack of intravenous contrast on the current study. The esophagus has normal imaging features. There is no axillary lymphadenopathy. Lungs/Pleura: 2 mm peripheral right lung nodule on 62/4 is stable. No substantial change 1.3 x 0.9 cm paraspinal right lower lobe nodule measured previously at 1.3 x 1.1 cm. No new suspicious pulmonary nodule or mass. No focal airspace consolidation. No pleural effusion. Upper Abdomen: Visualized portion of the upper abdomen shows no acute findings. Musculoskeletal: No worrisome lytic or sclerotic osseous abnormality. IMPRESSION: 1. Stable 1.3 x 0.9 cm paraspinal right lower lobe pulmonary nodule. 2. Stable 2 mm peripheral right lung nodule. 3.  Aortic Atherosclerosis (ICD10-I70.0). Electronically Signed   By: Camellia Candle M.D.   On: 12/27/2024 07:22   DG Chest Port 1 View Result Date: 12/21/2024 CLINICAL DATA:  Status post bronchoscopy and biopsy for right lower lobe lung nodule. EXAM: PORTABLE CHEST 1 VIEW COMPARISON:  CT chest 12/20/2024 FINDINGS: The heart size and mediastinal contours are within normal limits. No pneumothorax or pleural fluid following  bronchoscopy. Elevation of the right hemidiaphragm. No pulmonary edema. The visualized skeletal structures are unremarkable. IMPRESSION: No pneumothorax or pleural fluid following bronchoscopy. Elevation of the right hemidiaphragm. Electronically Signed   By: Marcey Moan M.D.   On: 12/21/2024 10:13   DG C-Arm 1-60 Min-No Report Result Date: 12/21/2024 Fluoroscopy was utilized by the requesting physician.  No radiographic interpretation.    ASSESSMENT: This is a very pleasant 54 years old African-American female recently diagnosed with a stage Ia (T1b, N0, M0) non-small cell lung cancer, adenocarcinoma presented with right lower lobe lung nodule diagnosed in January 2026.   PLAN: I had a lengthy discussion with the patient and her sister today about her current disease stage, prognosis and treatment options. I personally and independently reviewed the scan and the pathology report and discussed it with the patient today.  Assessment and Plan Assessment & Plan Stage 1A right lower lobe lung adenocarcinoma Recently diagnosed at an early stage following surveillance of a right lower lobe nodule, confirmed as adenocarcinoma by bronchoscopy. She remains asymptomatic with no respiratory complaints and normal pulmonary function. Lifelong non-smoker. Surgery is the preferred curative approach given early stage and good baseline  health. SBRT is an alternative if surgery is contraindicated due to pulmonary or cardiac risk. No indication for systemic therapy unless upstaging or nodal/metastatic involvement is identified at surgery. - Referred to thoracic surgery for evaluation and management, with surgery as the preferred treatment. - If surgery is contraindicated due to pulmonary or cardiac risk, will refer to radiation oncology for SBRT. - Planned follow-up in approximately six weeks to reassess post-surgical or post-radiation status and arrange ongoing surveillance imaging. - No chemotherapy, targeted  therapy, or immunotherapy indicated unless upstaging or nodal/metastatic involvement is found at surgery. The patient was advised to call immediately if she has any other concerning symptoms in the interval.  The patient voices understanding of current disease status and treatment options and is in agreement with the current care plan.  All questions were answered. The patient knows to call the clinic with any problems, questions or concerns. We can certainly see the patient much sooner if necessary.  Thank you so much for allowing me to participate in the care of Amy Gillespie. I will continue to follow up the patient with you and assist in her care. The total time spent in the appointment was 60 minutes including review of chart and various tests results, discussions about plan of care and coordination of care plan .   Disclaimer: This note was dictated with voice recognition software. Similar sounding words can inadvertently be transcribed and may not be corrected upon review.   Sherrod MARLA Sherrod January 13, 2025, 11:24 AM        [1]  Social History Tobacco Use   Smoking status: Never   Smokeless tobacco: Never  Vaping Use   Vaping status: Never Used  Substance Use Topics   Alcohol use: Yes    Alcohol/week: 1.0 standard drink of alcohol    Types: 1 Glasses of wine per week    Comment: socially   Drug use: Never  [2]  Allergies Allergen Reactions   Amoxicillin Nausea And Vomiting   "

## 2025-01-13 NOTE — Progress Notes (Signed)
 This NN met with the pt today at her consult appt with Dr. Sherrod. Pt was accompanied by her sister. NN explained the role of navigation in the cancer care continuum.  The plan for the pt is a consult with CT surgery, which she has on 2/10 with Dr Kerrin, and follow up with Dr Sherrod approximately 6 weeks after surgery to discuss next steps.  If pt is determined to not be a surgical candidate, she can be referred to radiation oncology.  No obvious barriers to pts care at this time.  NN provided pt with direct contact information and encouraged pt call with any questions or concerns.  NN escorted pt to scheduling to arrange for her follow up appt.

## 2025-01-18 ENCOUNTER — Encounter: Admitting: Thoracic Surgery (Cardiothoracic Vascular Surgery)

## 2025-02-24 ENCOUNTER — Inpatient Hospital Stay

## 2025-02-24 ENCOUNTER — Inpatient Hospital Stay: Admitting: Internal Medicine
